# Patient Record
Sex: Male | Born: 1970
Health system: Southern US, Community
[De-identification: ages and names within clinical notes are randomized; demographics above are authoritative.]

## PROBLEM LIST (undated history)

## (undated) DIAGNOSIS — T7840XA Allergy, unspecified, initial encounter: Secondary | ICD-10-CM

## (undated) DIAGNOSIS — D869 Sarcoidosis, unspecified: Secondary | ICD-10-CM

## (undated) DIAGNOSIS — F419 Anxiety disorder, unspecified: Secondary | ICD-10-CM

## (undated) HISTORY — PX: SPINE SURGERY: SHX786

## (undated) HISTORY — DX: Sarcoidosis, unspecified: D86.9

## (undated) HISTORY — DX: Allergy, unspecified, initial encounter: T78.40XA

## (undated) HISTORY — DX: Anxiety disorder, unspecified: F41.9

---

## 2006-03-25 ENCOUNTER — Ambulatory Visit: Payer: Self-pay | Admitting: Family Medicine

## 2009-07-01 ENCOUNTER — Ambulatory Visit: Payer: Self-pay | Admitting: Family Medicine

## 2009-07-07 ENCOUNTER — Encounter: Payer: Self-pay | Admitting: Family Medicine

## 2009-08-03 ENCOUNTER — Encounter: Payer: Self-pay | Admitting: Family Medicine

## 2011-03-16 ENCOUNTER — Ambulatory Visit: Payer: Self-pay | Admitting: Otolaryngology

## 2012-04-05 DIAGNOSIS — D869 Sarcoidosis, unspecified: Secondary | ICD-10-CM

## 2012-04-05 HISTORY — DX: Sarcoidosis, unspecified: D86.9

## 2012-09-01 ENCOUNTER — Ambulatory Visit: Payer: Self-pay

## 2012-09-03 HISTORY — PX: VIDEO BRONCHOSCOPY WITH ENDOBRONCHIAL ULTRASOUND: SHX6177

## 2012-09-19 ENCOUNTER — Ambulatory Visit: Payer: Self-pay

## 2013-12-19 ENCOUNTER — Encounter: Payer: Self-pay | Admitting: Pulmonary Disease

## 2013-12-19 ENCOUNTER — Ambulatory Visit (INDEPENDENT_AMBULATORY_CARE_PROVIDER_SITE_OTHER): Payer: BC Managed Care – PPO | Admitting: Pulmonary Disease

## 2013-12-19 VITALS — BP 124/68 | HR 58 | Temp 98.3°F | Ht 72.0 in | Wt 181.0 lb

## 2013-12-19 DIAGNOSIS — J99 Respiratory disorders in diseases classified elsewhere: Secondary | ICD-10-CM

## 2013-12-19 DIAGNOSIS — D869 Sarcoidosis, unspecified: Secondary | ICD-10-CM

## 2013-12-19 DIAGNOSIS — D860 Sarcoidosis of lung: Secondary | ICD-10-CM | POA: Insufficient documentation

## 2013-12-19 DIAGNOSIS — Z23 Encounter for immunization: Secondary | ICD-10-CM

## 2013-12-19 NOTE — Assessment & Plan Note (Signed)
Based on the descriptions of what was seen on his imagining and pathology at [facility], he has Stage 1 pulmonary sarcoidosis which has been asymptomatic with the exception of some mild arthralgias.  He has not shown signs of progression and it is likely that this will remain the case.  However, it should be noted that there were fungal hyphae seen on the FNA of the subcarinal node while all other culture data is positive.  I don't anticipate him developing respiratory symptoms (as most stage 1's don't), but if he does then he needs a bronch with an BAL for fungal culture prior to the initiation of systemic steroids.  Plan: -annual opthalmology visits -baseline EKG today -baseline CXR -flu shot today -f/u one year

## 2013-12-19 NOTE — Progress Notes (Signed)
Subjective:    Patient ID: Jorge Palmer, male    DOB: Aug 12, 1970, 43 y.o.   MRN: [id number]  HPI  Jorge Palmer is here to see me because he carries a diagnosis of sarcoidosis.  He was diagnosed in July 2014 with a bronchoscopy and an EBUS guided FNA of his level 7 mediastinal lymph node.  He had undergone a CT of his abdomen which showed pumonary nodules in the bases of his lungs which lead to the bronchoscopy.  Two summers ago he was hospitalized for pneumonia while on vacation in [place].  He was told that he had a bacterial and viral pneumonia based on blood testing.  Since then no complaints.  No shortness of breath, no cough.  Occasionally he will feel short of breath when he climbs a flight of stairs, but nothing too common.     He has had some joint pain and followed by a rheumatologist at [facility].  She treated him with meloxicam at 15mg  which seems to work fairly well.  He occasionally has back pain which is unrelated to his sarcoid.   Past Medical History  Diagnosis Date  . Sarcoidosis      Family History  Problem Relation Age of Onset  . Cancer Maternal Grandfather     lung  . Cancer Maternal Grandmother     ovarian  . Cancer Father     melanoma  . Heart disease Maternal Grandfather   . Rheum arthritis Maternal Grandmother      History   Social History  . Marital Status: Married    Spouse Name: N/A    Number of Children: N/A  . Years of Education: N/A   Occupational History  . Not on file.   Social History Main Topics  . Smoking status: Former Smoker -- 0.50 packs/day for 6 years    Types: Cigarettes    Quit date: 12/20/2003  . Smokeless tobacco: Never Used  . Alcohol Use: Yes     Comment: socially  . Drug Use: No  . Sexual Activity: Not on file   Other Topics Concern  . Not on file   Social History Narrative  . No narrative on file     Allergies  Allergen Reactions  . Sulfa Antibiotics     Rash-as an infant     No outpatient  prescriptions prior to visit.   No facility-administered medications prior to visit.      Review of Systems  Constitutional: Negative for fever and unexpected weight change.  HENT: Positive for congestion. Negative for dental problem, ear pain, nosebleeds, postnasal drip, rhinorrhea, sinus pressure, sneezing, sore throat and trouble swallowing.   Eyes: Negative for redness and itching.  Respiratory: Negative for cough, chest tightness, shortness of breath and wheezing.   Cardiovascular: Negative for palpitations and leg swelling.  Gastrointestinal: Negative for nausea and vomiting.  Genitourinary: Negative for dysuria.  Musculoskeletal: Negative for joint swelling.  Skin: Negative for rash.  Neurological: Negative for headaches.  Hematological: Does not bruise/bleed easily.  Psychiatric/Behavioral: Negative for dysphoric mood. The patient is not nervous/anxious.        Objective:   Physical Exam Filed Vitals:   12/19/13 1547  BP: 124/68  Pulse: 58  Temp: 98.3 F (36.8 C)  TempSrc: Oral  Height: 6' (1.829 m)  Weight: 181 lb (82.101 kg)  SpO2: 98%   Gen: well appearing, no acute distress HEENT: NCAT, PERRL, EOMi, OP clear, neck supple without masses PULM: CTA B CV: RRR, no  mgr, no JVD AB: BS+, soft, nontender, no hsm Ext: warm, no edema, no clubbing, no cyanosis Derm: no rash or skin breakdown Neuro: A&Ox4, CN II-XII intact, strength 5/5 in all 4 extremities       Assessment & Plan:   Pulmonary sarcoidosis Based on the descriptions of what was seen on his imagining and pathology at [facility], he has Stage 1 pulmonary sarcoidosis which has been asymptomatic with the exception of some mild arthralgias.  He has not shown signs of progression and it is likely that this will remain the case.  However, it should be noted that there were fungal hyphae seen on the FNA of the subcarinal node while all other culture data is positive.  I don't anticipate him developing respiratory  symptoms (as most stage 1's don't), but if he does then he needs a bronch with an BAL for fungal culture prior to the initiation of systemic steroids.  Plan: -annual opthalmology visits -baseline EKG today -baseline CXR -flu shot today -f/u one year   Updated Medication List Outpatient Encounter Prescriptions as of 12/19/2013  Medication Sig  . meloxicam (MOBIC) 15 MG tablet Take 15 mg by mouth daily.  . traMADol (ULTRAM) 50 MG tablet Take 50 mg by mouth every 6 (six) hours as needed.

## 2013-12-19 NOTE — Patient Instructions (Signed)
We will have you get a baseline chest X-ray at the [facility] office Keep getting your eyes checked annually We will see you back in one year, let us know if you need to see [place] sooner than that

## 2014-05-21 ENCOUNTER — Ambulatory Visit (INDEPENDENT_AMBULATORY_CARE_PROVIDER_SITE_OTHER)
Admission: RE | Admit: 2014-05-21 | Discharge: 2014-05-21 | Disposition: A | Discharge status: Home / Self Care | Payer: Self-pay | Source: Ambulatory Visit | Attending: Pulmonary Disease | Admitting: Pulmonary Disease

## 2014-05-21 DIAGNOSIS — D869 Sarcoidosis, unspecified: Secondary | ICD-10-CM

## 2014-05-22 NOTE — Progress Notes (Signed)
Quick Note:  Pt aware of results and recs. Has recall in chart for BT later this year. Advised to call us if he has any breathing issues. Nothing further needed. ______

## 2014-12-13 ENCOUNTER — Other Ambulatory Visit: Payer: Self-pay | Admitting: Orthopaedic Surgery

## 2014-12-13 DIAGNOSIS — M5416 Radiculopathy, lumbar region: Secondary | ICD-10-CM

## 2014-12-16 ENCOUNTER — Ambulatory Visit
Admission: RE | Admit: 2014-12-16 | Discharge: 2014-12-16 | Disposition: A | Discharge status: Home / Self Care | Payer: BLUE CROSS/BLUE SHIELD | Source: Ambulatory Visit | Attending: Orthopaedic Surgery | Admitting: Orthopaedic Surgery

## 2014-12-16 DIAGNOSIS — M5416 Radiculopathy, lumbar region: Secondary | ICD-10-CM

## 2015-01-07 ENCOUNTER — Telehealth: Payer: Self-pay | Admitting: Pulmonary Disease

## 2015-01-07 NOTE — Telephone Encounter (Signed)
Patient calling to see if anything has been ordered to have done prior to his appointment with Dr. Lake Bells.  Per Dr. Anastasia Pall last OV notes, there was nothing ordered to be done prior to appointment. Patient notified. Nothing further needed.

## 2015-01-16 ENCOUNTER — Encounter: Payer: Self-pay | Admitting: Family Medicine

## 2015-01-16 DIAGNOSIS — M4716 Other spondylosis with myelopathy, lumbar region: Secondary | ICD-10-CM | POA: Insufficient documentation

## 2015-02-17 ENCOUNTER — Encounter: Payer: Self-pay | Admitting: Pulmonary Disease

## 2015-02-17 ENCOUNTER — Ambulatory Visit (INDEPENDENT_AMBULATORY_CARE_PROVIDER_SITE_OTHER)
Admission: RE | Admit: 2015-02-17 | Discharge: 2015-02-17 | Disposition: A | Discharge status: Home / Self Care | Payer: BLUE CROSS/BLUE SHIELD | Source: Ambulatory Visit | Attending: Pulmonary Disease | Admitting: Pulmonary Disease

## 2015-02-17 ENCOUNTER — Ambulatory Visit (INDEPENDENT_AMBULATORY_CARE_PROVIDER_SITE_OTHER): Payer: BLUE CROSS/BLUE SHIELD | Admitting: Pulmonary Disease

## 2015-02-17 VITALS — BP 128/66 | HR 65 | Ht 72.0 in | Wt 189.0 lb

## 2015-02-17 DIAGNOSIS — D860 Sarcoidosis of lung: Secondary | ICD-10-CM

## 2015-02-17 DIAGNOSIS — Z23 Encounter for immunization: Secondary | ICD-10-CM | POA: Diagnosis not present

## 2015-02-17 DIAGNOSIS — R400 Somnolence: Secondary | ICD-10-CM | POA: Insufficient documentation

## 2015-02-17 DIAGNOSIS — G4710 Hypersomnia, unspecified: Secondary | ICD-10-CM

## 2015-02-17 NOTE — Patient Instructions (Signed)
Minimize alcohol and caffeine intake Exercise during the daytime Follow sleep hygiene sheet we gave you: Keep the lights off at night, no TV in the bedroom We will call you with the results of the chest x-ray Follow-up with [place] if you have a respiratory problem.

## 2015-02-17 NOTE — Assessment & Plan Note (Signed)
He has daytime somnolence with some snoring at night. His lung disease is not significant enough to dramatically increase his risk for obstructive sleep apnea and he is a thin individual so it's hard to say that I think he has obstructive sleep apnea. However, he does not have very good sleep hygiene as he notes that he sleeps with the television on at night. Today we reviewed sleep hygiene including caffeine and alcohol use and the bedroom environment. If he does not have improvement in his symptoms after adjusting these lifestyle modifications then we could consider a sleep study.

## 2015-02-17 NOTE — Assessment & Plan Note (Addendum)
He has mild pulmonary sarcoidosis with no symptoms and no significant chest x-ray findings. Last year's chest x-ray was normal. Because he has not had an exacerbation of his disease I do not think we need to see him any further. Plan: Chest x-ray today Follow-up if he develops respiratory symptoms.

## 2015-02-17 NOTE — Progress Notes (Signed)
Subjective:    Patient ID: Jorge Palmer, male    DOB: [birth year], 44 y.o.   MRN: [id number]  Synopsis: Diagnosed with sarcoidosis at [facility] with a fine-needle aspiration of a mediastinal lymph node. Disease has been relatively quiet since the time of diagnosis.  HPI Chief Complaint  Patient presents with  . Follow-up    annual rov.  pt has no breathing complaints today.     No breathing trouble in the last year. His wife says that he will gag from time to time at night. He gets sleepy a lot during the daytime.  He notes restless sleeping during the daytime.  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  He has morning headaches from time to time. He associates it with his sinus problems. He will ocassionally urinate in the middle of the night, not often.    Past Medical History  Diagnosis Date  . Sarcoidosis (HCC)       Review of Systems     Objective:   Physical Exam Filed Vitals:   02/17/15 1156  BP: 128/66  Pulse: 65  Height: 6' (1.829 m)  Weight: 189 lb (85.73 kg)  SpO2: 98%  RA  Gen: well appearing HENT: OP clear, TM's clear, neck supple PULM: CTA B, normal percussion CV: RRR, no mgr, trace edema GI: BS+, soft, nontender Derm: no cyanosis or rash Psyche: normal mood and affect         Assessment & Plan:  Pulmonary sarcoidosis He has mild pulmonary sarcoidosis with no symptoms and no significant chest x-ray findings. Last year's chest x-ray was normal. Because he has not had an exacerbation of his disease I do not think we need to see him any further. Plan: Chest x-ray today Follow-up if he develops respiratory symptoms.  Daytime somnolence He has daytime somnolence with some snoring at night. His lung disease is not significant enough to dramatically increase his risk for obstructive sleep apnea and he is a thin individual so it's hard to say that I think he has obstructive sleep apnea. However, he does not have very  good sleep hygiene as he notes that he sleeps with the television on at night. Today we reviewed sleep hygiene including caffeine and alcohol use and the bedroom environment. If he does not have improvement in his symptoms after adjusting these lifestyle modifications then we could consider a sleep study.     Current outpatient prescriptions:  .  cetirizine (ZYRTEC) 10 MG tablet, Take 10 mg by mouth daily., Disp: , Rfl:  .  gabapentin (NEURONTIN) 600 MG tablet, Take 600 mg by mouth 3 (three) times daily., Disp: , Rfl:  .  traMADol (ULTRAM) 50 MG tablet, Take 50 mg by mouth every 6 (six) hours as needed., Disp: , Rfl:

## 2015-02-19 NOTE — Progress Notes (Signed)
Quick Note:  Spoke with pt and notified of results per Dr. McQuaid. Pt verbalized understanding and denied any questions.  ______ 

## 2015-09-26 DIAGNOSIS — L281 Prurigo nodularis: Secondary | ICD-10-CM | POA: Diagnosis not present

## 2015-09-26 DIAGNOSIS — L700 Acne vulgaris: Secondary | ICD-10-CM | POA: Diagnosis not present

## 2015-10-01 DIAGNOSIS — M5106 Intervertebral disc disorders with myelopathy, lumbar region: Secondary | ICD-10-CM | POA: Diagnosis not present

## 2015-10-01 DIAGNOSIS — M4806 Spinal stenosis, lumbar region: Secondary | ICD-10-CM | POA: Diagnosis not present

## 2015-10-01 DIAGNOSIS — M4716 Other spondylosis with myelopathy, lumbar region: Secondary | ICD-10-CM | POA: Diagnosis not present

## 2015-10-03 ENCOUNTER — Other Ambulatory Visit: Payer: Self-pay | Admitting: Orthopaedic Surgery

## 2015-10-03 DIAGNOSIS — M5106 Intervertebral disc disorders with myelopathy, lumbar region: Secondary | ICD-10-CM

## 2015-10-03 DIAGNOSIS — M4716 Other spondylosis with myelopathy, lumbar region: Secondary | ICD-10-CM

## 2015-10-10 DIAGNOSIS — M5126 Other intervertebral disc displacement, lumbar region: Secondary | ICD-10-CM | POA: Diagnosis not present

## 2015-10-10 DIAGNOSIS — M4806 Spinal stenosis, lumbar region: Secondary | ICD-10-CM | POA: Diagnosis not present

## 2015-10-10 DIAGNOSIS — M5127 Other intervertebral disc displacement, lumbosacral region: Secondary | ICD-10-CM | POA: Diagnosis not present

## 2016-01-01 DIAGNOSIS — Z4689 Encounter for fitting and adjustment of other specified devices: Secondary | ICD-10-CM | POA: Diagnosis not present

## 2016-01-01 DIAGNOSIS — M5136 Other intervertebral disc degeneration, lumbar region: Secondary | ICD-10-CM | POA: Diagnosis not present

## 2016-01-01 DIAGNOSIS — M545 Low back pain: Secondary | ICD-10-CM | POA: Diagnosis not present

## 2016-01-04 HISTORY — PX: OTHER SURGICAL HISTORY: SHX169

## 2016-01-06 DIAGNOSIS — Z01818 Encounter for other preprocedural examination: Secondary | ICD-10-CM | POA: Diagnosis not present

## 2016-01-12 DIAGNOSIS — Z8673 Personal history of transient ischemic attack (TIA), and cerebral infarction without residual deficits: Secondary | ICD-10-CM | POA: Diagnosis not present

## 2016-01-12 DIAGNOSIS — R51 Headache: Secondary | ICD-10-CM | POA: Diagnosis not present

## 2016-01-12 DIAGNOSIS — N189 Chronic kidney disease, unspecified: Secondary | ICD-10-CM | POA: Diagnosis not present

## 2016-01-12 DIAGNOSIS — M48061 Spinal stenosis, lumbar region without neurogenic claudication: Secondary | ICD-10-CM | POA: Diagnosis not present

## 2016-01-12 DIAGNOSIS — M4716 Other spondylosis with myelopathy, lumbar region: Secondary | ICD-10-CM | POA: Diagnosis not present

## 2016-01-12 DIAGNOSIS — M5106 Intervertebral disc disorders with myelopathy, lumbar region: Secondary | ICD-10-CM | POA: Diagnosis not present

## 2016-01-12 DIAGNOSIS — M4726 Other spondylosis with radiculopathy, lumbar region: Secondary | ICD-10-CM | POA: Diagnosis not present

## 2016-01-12 DIAGNOSIS — Z79899 Other long term (current) drug therapy: Secondary | ICD-10-CM | POA: Diagnosis not present

## 2016-01-12 DIAGNOSIS — Z981 Arthrodesis status: Secondary | ICD-10-CM | POA: Diagnosis not present

## 2016-01-13 ENCOUNTER — Encounter: Payer: Self-pay | Admitting: Family Medicine

## 2016-01-13 DIAGNOSIS — Z79899 Other long term (current) drug therapy: Secondary | ICD-10-CM | POA: Diagnosis not present

## 2016-01-13 DIAGNOSIS — N189 Chronic kidney disease, unspecified: Secondary | ICD-10-CM | POA: Diagnosis not present

## 2016-01-13 DIAGNOSIS — M4726 Other spondylosis with radiculopathy, lumbar region: Secondary | ICD-10-CM | POA: Diagnosis not present

## 2016-01-13 DIAGNOSIS — R51 Headache: Secondary | ICD-10-CM | POA: Diagnosis not present

## 2016-01-13 DIAGNOSIS — Z8673 Personal history of transient ischemic attack (TIA), and cerebral infarction without residual deficits: Secondary | ICD-10-CM | POA: Diagnosis not present

## 2016-04-30 DIAGNOSIS — M5416 Radiculopathy, lumbar region: Secondary | ICD-10-CM | POA: Diagnosis not present

## 2016-04-30 DIAGNOSIS — Z6825 Body mass index (BMI) 25.0-25.9, adult: Secondary | ICD-10-CM | POA: Diagnosis not present

## 2016-04-30 DIAGNOSIS — M5136 Other intervertebral disc degeneration, lumbar region: Secondary | ICD-10-CM | POA: Diagnosis not present

## 2016-09-24 DIAGNOSIS — L281 Prurigo nodularis: Secondary | ICD-10-CM | POA: Diagnosis not present

## 2016-09-24 DIAGNOSIS — L700 Acne vulgaris: Secondary | ICD-10-CM | POA: Diagnosis not present

## 2017-06-21 ENCOUNTER — Ambulatory Visit: Payer: BLUE CROSS/BLUE SHIELD | Admitting: Family Medicine

## 2017-06-21 ENCOUNTER — Encounter: Payer: Self-pay | Admitting: Family Medicine

## 2017-06-21 VITALS — BP 136/82 | HR 65 | Temp 98.3°F | Resp 16 | Ht 72.0 in | Wt 183.1 lb

## 2017-06-21 DIAGNOSIS — J3089 Other allergic rhinitis: Secondary | ICD-10-CM | POA: Diagnosis not present

## 2017-06-21 DIAGNOSIS — K580 Irritable bowel syndrome with diarrhea: Secondary | ICD-10-CM | POA: Diagnosis not present

## 2017-06-21 DIAGNOSIS — M4716 Other spondylosis with myelopathy, lumbar region: Secondary | ICD-10-CM

## 2017-06-21 DIAGNOSIS — D860 Sarcoidosis of lung: Secondary | ICD-10-CM | POA: Diagnosis not present

## 2017-06-21 DIAGNOSIS — R1013 Epigastric pain: Secondary | ICD-10-CM

## 2017-06-21 DIAGNOSIS — Z131 Encounter for screening for diabetes mellitus: Secondary | ICD-10-CM | POA: Diagnosis not present

## 2017-06-21 DIAGNOSIS — J302 Other seasonal allergic rhinitis: Secondary | ICD-10-CM | POA: Diagnosis not present

## 2017-06-21 DIAGNOSIS — R110 Nausea: Secondary | ICD-10-CM

## 2017-06-21 DIAGNOSIS — Z13220 Encounter for screening for lipoid disorders: Secondary | ICD-10-CM | POA: Diagnosis not present

## 2017-06-21 DIAGNOSIS — K589 Irritable bowel syndrome without diarrhea: Secondary | ICD-10-CM | POA: Insufficient documentation

## 2017-06-21 MED ORDER — TRAMADOL HCL 50 MG PO TABS: 50.0000 mg | ORAL_TABLET | Freq: Four times a day (QID) | ORAL | 0 refills | Status: DC | PRN

## 2017-06-21 NOTE — Patient Instructions (Signed)
Research IBS diarrhea medication: Rebeca Allegra

## 2017-06-21 NOTE — Progress Notes (Signed)
Name: Jorge Palmer   MRN: [id number]    DOB: 1971-01-04   Date:06/21/2017       Progress Note  Subjective  Chief Complaint  Chief Complaint  Patient presents with  . GI Problem    Onset-6 to 8 weeks, has been nausea and feels like something is stuck in his throat, going to bathroom more frequently. Has tried Prilosec and imodium. Has sharp upper abdominal pains after eating and discomforter. Pain will wake him up and would like to discuss his symptoms.    HPI  Dyspepsia: he states symptoms started about 2 months ago. He has an almost constant sensation of indigestion, nausea, but no vomiting. Also some increase in bowel frequency ( worse than his regular IBS symptoms) . Pain on epigastric area that is sharp and may last hours, and also burning sensation from epigastric area to his throat. He denies any blood in stools, weight loss or change in appetite. He is worried about his gallbladder ( father recent had emergency cholecystectomy). No fever or chills.   Pulmonary sarcoidosis: seen at [facility] and Dr. Kendrick Fries in the past, but released from their care, denies cough, wheezing or SOB, we will repeat CXR  IBS: he states he has increase in bowel frequency associated with cramping and urge to defecate since college years. He states symptoms improve with Imodium, and worse with stress. He is not sure if he ever took medication for it.   Low back pain: seen by neurosurgeon and takes prn tramadol, states since back surgery he feels much better. Only occasionally needs to take medication ( usually after yard work) no radiculitis at this time.    Patient Active Problem List   Diagnosis Date Noted  . Daytime somnolence 02/17/2015  . Lumbar spondylosis with myelopathy 01/16/2015  . Pulmonary sarcoidosis (HCC) 12/19/2013    Past Surgical History:  Procedure Laterality Date  . microlaminectomy L4-5  01/2016    Family History  Problem Relation Age of Onset  . Cancer Maternal Grandfather     lung  . Heart disease Maternal Grandfather   . Cancer Maternal Grandmother        ovarian  . Rheum arthritis Maternal Grandmother   . Cancer Father        melanoma  . Migraines Sister   . Parkinson's disease Paternal Grandmother   . Liver disease Paternal Grandfather     Social History   Socioeconomic History  . Marital status: Married    Spouse name: Selena Batten   . Number of children: 1  . Years of education: Not on file  . Highest education level: Bachelor's degree (e.g., BA, AB, BS)  Social Needs  . Financial resource strain: Not hard at all  . Food insecurity - worry: Never true  . Food insecurity - inability: Never true  . Transportation needs - medical: No  . Transportation needs - non-medical: No  Occupational History  . Occupation: area Warehouse manager     Comment: [employer].   Tobacco Use  . Smoking status: Former Smoker    Packs/day: 0.50    Years: 6.00    Pack years: 3.00    Types: Cigarettes    Start date: 04/05/1997    Last attempt to quit: 12/20/2003    Years since quitting: 13.5  . Smokeless tobacco: Never Used  Substance and Sexual Activity  . Alcohol use: No    Frequency: Never    Comment: since 2017 never a heavy drinker  . Drug use:  No  . Sexual activity: Yes    Partners: Female  Other Topics Concern  . Not on file  Social History Narrative   Married, one child.      Current Outpatient Medications:  .  omeprazole (PRILOSEC) 20 MG capsule, Take 20 mg by mouth daily., Disp: , Rfl:  .  traMADol (ULTRAM) 50 MG tablet, Take 1 tablet (50 mg total) by mouth every 6 (six) hours as needed., Disp: 30 tablet, Rfl: 0  Allergies  Allergen Reactions  . Sulfa Antibiotics     Rash-as an infant     ROS  Ten systems reviewed and is negative except as mentioned in HPI   Objective  Vitals:   06/21/17 1156  BP: 136/82  Pulse: 65  Resp: 16  Temp: 98.3 F (36.8 C)  TempSrc: Oral  SpO2: 97%  Weight: 183 lb 1.6 oz (83.1 kg)  Height: 6' (1.829  m)    Body mass index is 24.83 kg/m.  Physical Exam  Constitutional: Patient appears well-developed and well-nourished. No distress.  HEENT: head atraumatic, normocephalic, pupils equal and reactive to light, neck supple, throat within normal limits Cardiovascular: Normal rate, regular rhythm and normal heart sounds.  No murmur heard. No BLE edema. Pulmonary/Chest: Effort normal and breath sounds normal. No respiratory distress. Abdominal: Soft.  There is epigastric  Tenderness, negative for masses, normal bowel sounds. Psychiatric: Patient has a normal mood and affect. behavior is normal. Judgment and thought content normal.  PHQ2/9: Depression screen PHQ 2/9 06/21/2017  Decreased Interest 0  Down, Depressed, Hopeless 0  PHQ - 2 Score 0     Fall Risk: Fall Risk  06/21/2017  Falls in the past year? No     Functional Status Survey: Is the patient deaf or have difficulty hearing?: Yes Does the patient have difficulty seeing, even when wearing glasses/contacts?: Yes Does the patient have difficulty concentrating, remembering, or making decisions?: No Does the patient have difficulty walking or climbing stairs?: No Does the patient have difficulty dressing or bathing?: No Does the patient have difficulty doing errands alone such as visiting a doctor's office or shopping?: No    Assessment & Plan  1. Pulmonary sarcoidosis (HCC)  Repeat CXR   2. Perennial allergic rhinitis with seasonal variation  He does not like taking medications for it  3. Lumbar spondylosis with myelopathy  - traMADol (ULTRAM) 50 MG tablet; Take 1 tablet (50 mg total) by mouth every 6 (six) hours as needed.  Dispense: 30 tablet; Refill: 0  4. Lipid screening  - Lipid panel  5. Epigastric pain  - CBC with Differential/Platelet - COMPLETE METABOLIC PANEL WITH GFR - US Abdomen Limited RUQ; Future - Lipase  6. Nausea  - CBC with Differential/Platelet - COMPLETE METABOLIC PANEL WITH GFR -  US Abdomen Limited RUQ; Future - Lipase  7. Irritable bowel syndrome with diarrhea  He will research Viberzi and Xyfaxin   8. Diabetes mellitus screening  - Hemoglobin A1c  9. Dyspepsia  - H. pylori breath test - US Abdomen Limited RUQ; Future - Lipase

## 2017-06-28 ENCOUNTER — Ambulatory Visit
Admission: RE | Admit: 2017-06-28 | Discharge: 2017-06-28 | Disposition: A | Discharge status: Home / Self Care | Payer: BLUE CROSS/BLUE SHIELD | Source: Ambulatory Visit | Attending: Family Medicine | Admitting: Family Medicine

## 2017-06-28 DIAGNOSIS — D860 Sarcoidosis of lung: Secondary | ICD-10-CM

## 2017-06-28 DIAGNOSIS — Z13220 Encounter for screening for lipoid disorders: Secondary | ICD-10-CM | POA: Diagnosis not present

## 2017-06-28 DIAGNOSIS — R1013 Epigastric pain: Secondary | ICD-10-CM | POA: Insufficient documentation

## 2017-06-28 DIAGNOSIS — R110 Nausea: Secondary | ICD-10-CM | POA: Diagnosis not present

## 2017-06-28 DIAGNOSIS — R918 Other nonspecific abnormal finding of lung field: Secondary | ICD-10-CM | POA: Diagnosis not present

## 2017-06-28 DIAGNOSIS — Z131 Encounter for screening for diabetes mellitus: Secondary | ICD-10-CM | POA: Diagnosis not present

## 2017-06-29 LAB — COMPLETE METABOLIC PANEL WITH GFR
AG Ratio: 2 (calc) (ref 1.0–2.5)
ALT: 25 U/L (ref 9–46)
AST: 25 U/L (ref 10–40)
Albumin: 4.6 g/dL (ref 3.6–5.1)
Alkaline phosphatase (APISO): 52 U/L (ref 40–115)
BUN: 21 mg/dL (ref 7–25)
CO2: 31 mmol/L (ref 20–32)
CREATININE: 1.08 mg/dL (ref 0.60–1.35)
Calcium: 9.4 mg/dL (ref 8.6–10.3)
Chloride: 104 mmol/L (ref 98–110)
GFR, Est African American: 96 mL/min/{1.73_m2} (ref >=60)
GFR, Est Non African American: 82 mL/min/{1.73_m2} (ref >=60)
GLUCOSE: 92 mg/dL (ref 65–99)
Globulin: 2.3 g/dL (calc) (ref 1.9–3.7)
Potassium: 4.5 mmol/L (ref 3.5–5.3)
Sodium: 142 mmol/L (ref 135–146)
Total Bilirubin: 1 mg/dL (ref 0.2–1.2)
Total Protein: 6.9 g/dL (ref 6.1–8.1)

## 2017-06-29 LAB — CBC WITH DIFFERENTIAL/PLATELET
Basophils Absolute: 42 cells/uL (ref 0–200)
Basophils Relative: 0.8 %
EOS PCT: 4 %
Eosinophils Absolute: 208 cells/uL (ref 15–500)
HCT: 44.6 % (ref 38.5–50.0)
HEMOGLOBIN: 15.2 g/dL (ref 13.2–17.1)
Lymphs Abs: 1050 cells/uL (ref 850–3900)
MCH: 30.1 pg (ref 27.0–33.0)
MCHC: 34.1 g/dL (ref 32.0–36.0)
MCV: 88.3 fL (ref 80.0–100.0)
MONOS PCT: 6.7 %
MPV: 11.5 fL (ref 7.5–12.5)
NEUTROS ABS: 3552 {cells}/uL (ref 1500–7800)
NEUTROS PCT: 68.3 %
Platelets: 262 10*3/uL (ref 140–400)
RBC: 5.05 10*6/uL (ref 4.20–5.80)
RDW: 12.1 % (ref 11.0–15.0)
Total Lymphocyte: 20.2 %
WBC mixed population: 348 cells/uL (ref 200–950)
WBC: 5.2 10*3/uL (ref 3.8–10.8)

## 2017-06-29 LAB — H. PYLORI BREATH TEST: H. pylori Breath Test: NOT DETECTED

## 2017-06-29 LAB — LIPID PANEL
CHOL/HDL RATIO: 4.4 (calc) (ref <5.0)
Cholesterol: 192 mg/dL (ref <200)
HDL: 44 mg/dL (ref >40)
LDL CHOLESTEROL (CALC): 128 mg/dL — AB
Non-HDL Cholesterol (Calc): 148 mg/dL (calc) — ABNORMAL HIGH (ref <130)
Triglycerides: 97 mg/dL (ref <150)

## 2017-06-29 LAB — LIPASE: Lipase: 9 U/L (ref 7–60)

## 2017-06-29 LAB — HEMOGLOBIN A1C
EAG (MMOL/L): 5.8 (calc)
HEMOGLOBIN A1C: 5.3 %{Hb} (ref <5.7)
Mean Plasma Glucose: 105 (calc)

## 2017-07-01 ENCOUNTER — Telehealth: Payer: Self-pay | Admitting: Family Medicine

## 2017-07-01 NOTE — Telephone Encounter (Signed)
Pt given results and documented in result note 

## 2017-07-01 NOTE — Telephone Encounter (Signed)
Copied from [place] [id number]. Topic: Quick Communication - Lab Results >> Jul 01, 2017  9:49 AM Vonna Kotyk, CMA wrote: Called patient to inform them of 06/28/2017 lab results. When patient returns call, triage nurse may disclose results. >> Jul 01, 2017 10:38 AM Scherrie Gerlach wrote: Please call pt back with results.  Unable to contact NT

## 2017-09-23 DIAGNOSIS — S20461A Insect bite (nonvenomous) of right back wall of thorax, initial encounter: Secondary | ICD-10-CM | POA: Diagnosis not present

## 2017-09-23 DIAGNOSIS — S50861A Insect bite (nonvenomous) of right forearm, initial encounter: Secondary | ICD-10-CM | POA: Diagnosis not present

## 2017-09-23 DIAGNOSIS — S50862A Insect bite (nonvenomous) of left forearm, initial encounter: Secondary | ICD-10-CM | POA: Diagnosis not present

## 2017-09-23 DIAGNOSIS — S70362A Insect bite (nonvenomous), left thigh, initial encounter: Secondary | ICD-10-CM | POA: Diagnosis not present

## 2018-04-07 DIAGNOSIS — M545 Low back pain: Secondary | ICD-10-CM | POA: Diagnosis not present

## 2018-04-07 DIAGNOSIS — M4716 Other spondylosis with myelopathy, lumbar region: Secondary | ICD-10-CM | POA: Diagnosis not present

## 2018-04-07 DIAGNOSIS — M5136 Other intervertebral disc degeneration, lumbar region: Secondary | ICD-10-CM | POA: Diagnosis not present

## 2018-09-02 ENCOUNTER — Encounter: Payer: Self-pay | Admitting: Emergency Medicine

## 2018-09-02 ENCOUNTER — Emergency Department: Payer: BLUE CROSS/BLUE SHIELD

## 2018-09-02 ENCOUNTER — Other Ambulatory Visit: Payer: Self-pay

## 2018-09-02 ENCOUNTER — Emergency Department
Admission: EM | Admit: 2018-09-02 | Discharge: 2018-09-02 | Disposition: A | Discharge status: Home / Self Care | Payer: BLUE CROSS/BLUE SHIELD | Attending: Emergency Medicine | Admitting: Emergency Medicine

## 2018-09-02 DIAGNOSIS — R109 Unspecified abdominal pain: Secondary | ICD-10-CM | POA: Diagnosis not present

## 2018-09-02 DIAGNOSIS — Z87891 Personal history of nicotine dependence: Secondary | ICD-10-CM | POA: Diagnosis not present

## 2018-09-02 DIAGNOSIS — N23 Unspecified renal colic: Secondary | ICD-10-CM | POA: Diagnosis not present

## 2018-09-02 DIAGNOSIS — R911 Solitary pulmonary nodule: Secondary | ICD-10-CM | POA: Insufficient documentation

## 2018-09-02 DIAGNOSIS — Z79899 Other long term (current) drug therapy: Secondary | ICD-10-CM | POA: Diagnosis not present

## 2018-09-02 DIAGNOSIS — R319 Hematuria, unspecified: Secondary | ICD-10-CM | POA: Diagnosis not present

## 2018-09-02 LAB — URINALYSIS, COMPLETE (UACMP) WITH MICROSCOPIC
Bacteria, UA: NONE SEEN
Bilirubin Urine: NEGATIVE
Glucose, UA: NEGATIVE mg/dL
Ketones, ur: NEGATIVE mg/dL
Leukocytes,Ua: NEGATIVE
Nitrite: NEGATIVE
Protein, ur: 30 mg/dL — AB
RBC / HPF: >50 RBC/hpf — ABNORMAL HIGH (ref 0–5)
Specific Gravity, Urine: 1.021 (ref 1.005–1.030)
Squamous Epithelial / LPF: NONE SEEN (ref 0–5)
pH: 6 (ref 5.0–8.0)

## 2018-09-02 LAB — CBC
HCT: 46.3 % (ref 39.0–52.0)
Hemoglobin: 15.9 g/dL (ref 13.0–17.0)
MCH: 30.9 pg (ref 26.0–34.0)
MCHC: 34.3 g/dL (ref 30.0–36.0)
MCV: 90.1 fL (ref 80.0–100.0)
Platelets: 220 10*3/uL (ref 150–400)
RBC: 5.14 MIL/uL (ref 4.22–5.81)
RDW: 12.1 % (ref 11.5–15.5)
WBC: 9.6 10*3/uL (ref 4.0–10.5)
nRBC: 0 % (ref 0.0–0.2)

## 2018-09-02 LAB — BASIC METABOLIC PANEL
Anion gap: 8 (ref 5–15)
BUN: 24 mg/dL — ABNORMAL HIGH (ref 6–20)
CO2: 29 mmol/L (ref 22–32)
Calcium: 9.1 mg/dL (ref 8.9–10.3)
Chloride: 103 mmol/L (ref 98–111)
Creatinine, Ser: 1.08 mg/dL (ref 0.61–1.24)
GFR calc Af Amer: >60 mL/min (ref >60)
GFR calc non Af Amer: >60 mL/min (ref >60)
Glucose, Bld: 139 mg/dL — ABNORMAL HIGH (ref 70–99)
Potassium: 4.1 mmol/L (ref 3.5–5.1)
Sodium: 140 mmol/L (ref 135–145)

## 2018-09-02 MED ORDER — ONDANSETRON 4 MG PO TBDP: 4.0000 mg | ORAL_TABLET | Freq: Three times a day (TID) | ORAL | 0 refills | Status: DC | PRN

## 2018-09-02 MED ORDER — OXYCODONE-ACETAMINOPHEN 10-325 MG PO TABS: 1.0000 | ORAL_TABLET | Freq: Four times a day (QID) | ORAL | 0 refills | Status: AC | PRN

## 2018-09-02 NOTE — Discharge Instructions (Addendum)
Please see primary care or return to the emergency department if the pain returns.  Follow up with [facility] regarding the new pulmonary nodule.

## 2018-09-02 NOTE — ED Triage Notes (Signed)
L flank pain began this am.

## 2018-09-02 NOTE — ED Notes (Signed)
Pt reports that starting this am he had sharp pain in left flank that radiates to front mid abd accompanied by nausea - the pain was 10/10 - he is having difficulty voiding (feels like he has to void and cannot) - at this time he has a dull ache in left flank region

## 2018-09-02 NOTE — ED Provider Notes (Signed)
[facility] EMERGENCY DEPARTMENT Provider Note   CSN: [id number] Arrival date & time: 09/02/18  1124    History   Chief Complaint Chief Complaint  Patient presents with  . Flank Pain    HPI Jorge Palmer is a 48 y.o. male.     48 year old male presenting to the emergency department for treatment and evaluation of left flank pain.  Pain started suddenly this morning.  He initially had some nausea but no vomiting.  He states that he feels the urge to urinate, but only small amounts with each attempt.  History of kidney stone approximately 20 years ago.     Past Medical History:  Diagnosis Date  . Sarcoidosis     Patient Active Problem List   Diagnosis Date Noted  . Perennial allergic rhinitis with seasonal variation 06/21/2017  . Irritable bowel syndrome with diarrhea 06/21/2017  . Irritable bowel syndrome (IBS) 06/21/2017  . Daytime somnolence 02/17/2015  . Lumbar spondylosis with myelopathy 01/16/2015  . Pulmonary sarcoidosis ([place]) 12/19/2013    Past Surgical History:  Procedure Laterality Date  . microlaminectomy L4-5  01/2016        Home Medications    Prior to Admission medications   Medication Sig Start Date End Date Taking? Authorizing Provider  cetirizine (ZYRTEC) 10 MG tablet Take 10 mg by mouth daily.    [provider]  gabapentin (NEURONTIN) 600 MG tablet Take 600 mg by mouth 3 (three) times daily.    [provider]  omeprazole (PRILOSEC) 20 MG capsule Take 20 mg by mouth daily.    [provider]  ondansetron (ZOFRAN-ODT) 4 MG disintegrating tablet Take 1 tablet (4 mg total) by mouth every 8 (eight) hours as needed for nausea or vomiting. 09/02/18   Shed Nixon B, FNP  oxyCODONE-acetaminophen (PERCOCET) 10-325 MG tablet Take 1 tablet by mouth every 6 (six) hours as needed for pain. 09/02/18 09/02/19  Meher Kucinski, Dessa Phi, FNP  traMADol (ULTRAM) 50 MG tablet Take 1 tablet (50 mg total) by mouth every  6 (six) hours as needed. 06/21/17   Steele Sizer, MD    Family History Family History  Problem Relation Age of Onset  . Cancer Maternal Grandfather        lung  . Heart disease Maternal Grandfather   . Cancer Maternal Grandmother        ovarian  . Rheum arthritis Maternal Grandmother   . Cancer Father        melanoma  . Migraines Sister   . Parkinson's disease Paternal Grandmother   . Liver disease Paternal Grandfather     Social History Social History   Tobacco Use  . Smoking status: Former Smoker    Packs/day: 0.50    Years: 6.00    Pack years: 3.00    Types: Cigarettes    Start date: 04/05/1997    Last attempt to quit: 12/20/2003    Years since quitting: 14.7  . Smokeless tobacco: Never Used  Substance Use Topics  . Alcohol use: No    Frequency: Never    Comment: since 2017 never a heavy drinker  . Drug use: No     Allergies   Sulfa antibiotics   Review of Systems Review of Systems  Constitutional: Negative.   HENT: Negative.   Eyes: Negative.   Respiratory: Negative.   Cardiovascular: Negative.   Gastrointestinal: Positive for nausea.  Genitourinary: Positive for flank pain.  Neurological: Negative.   Psychiatric/Behavioral: Negative.  Physical Exam Updated Vital Signs BP 116/60   Pulse 60   Temp 98.3 F (36.8 C) (Oral)   Resp 18   Ht 6' (1.829 m)   Wt 83.9 kg   SpO2 97%   BMI 25.09 kg/m   Physical Exam Vitals signs reviewed.  HENT:     Head: Normocephalic.     Nose: Nose normal.     Mouth/Throat:     Mouth: Mucous membranes are moist.  Neck:     Musculoskeletal: Neck supple.  Cardiovascular:     Rate and Rhythm: Normal rate and regular rhythm.  Pulmonary:     Effort: Pulmonary effort is normal.     Breath sounds: Normal breath sounds.  Abdominal:     General: There is no distension.     Palpations: Abdomen is soft.     Tenderness: There is no abdominal tenderness. There is left CVA tenderness. There is no guarding.   Musculoskeletal: Normal range of motion.  Skin:    General: Skin is warm and dry.  Neurological:     General: No focal deficit present.     Mental Status: He is alert.  Psychiatric:        Mood and Affect: Mood normal.        Thought Content: Thought content normal.        Judgment: Judgment normal.      ED Treatments / Results  Labs (all labs ordered are listed, but only abnormal results are displayed) Labs Reviewed  URINALYSIS, COMPLETE (UACMP) WITH MICROSCOPIC - Abnormal; Notable for the following components:      Result Value   Color, Urine YELLOW (*)    APPearance HAZY (*)    Hgb urine dipstick LARGE (*)    Protein, ur 30 (*)    RBC / HPF >50 (*)    All other components within normal limits  BASIC METABOLIC PANEL - Abnormal; Notable for the following components:   Glucose, Bld 139 (*)    BUN 24 (*)    All other components within normal limits  CBC    EKG None  Radiology Ct Renal Stone Study  Result Date: 09/02/2018 CLINICAL DATA:  Left flank pain and hematuria. EXAM: CT ABDOMEN AND PELVIS WITHOUT CONTRAST TECHNIQUE: Multidetector CT imaging of the abdomen and pelvis was performed following the standard protocol without IV contrast. COMPARISON:  09/01/2012 FINDINGS: Lower chest: Several nodules in the left lower lobe measuring up to 10 mm are stable. There is 1 nodule in the posterior right lower lobe measuring 4 mm which is new. See image 10 of series 4. Other small nodules in the right lower lobe are unchanged. Hepatobiliary: Unremarkable Pancreas: Unremarkable Spleen: Unremarkable Adrenals/Urinary Tract: Right kidney and adrenal glands are within normal limits. Stable simple cyst in the mid left kidney. This is nonspecific. There is no hydronephrosis. Bladder is unremarkable. Stomach/Bowel: Normal appendix. No obvious mass in the colon. Small bowel is decompressed. Stomach is also decompressed. Vascular/Lymphatic: No evidence of aortic aneurysm. No abnormal  retroperitoneal adenopathy. Reproductive: Normal prostate. Other: No free fluid. Musculoskeletal: No vertebral compression deformity. Degenerative disc disease at L4-5 and L5-S1. IMPRESSION: No evidence of urinary obstruction or urinary calculus. Small pulmonary nodules in the lung bases are noted. Most are stable. There is a new 4 mm right lower lobe pulmonary nodule. No follow-up needed if patient is low-risk. Non-contrast chest CT can be considered in 12 months if patient is high-risk. This recommendation follows the consensus statement: Guidelines for Management of  Incidental Pulmonary Nodules Detected on CT Images: From the Fleischner Society 2017; Radiology 2017; 284:228-243. Electronically Signed   By: Marybelle Killings M.D.   On: 09/02/2018 13:22    Procedures Procedures (including critical care time)  Medications Ordered in ED Medications - No data to display   Initial Impression / Assessment and Plan / ED Course  I have reviewed the triage vital signs and the nursing notes.  Pertinent labs & imaging results that were available during my care of the patient were reviewed by me and considered in my medical decision making (see chart for details).        48 year old male presenting to the emergency department with left flank pain.  Location of pain, and microscopic hematuria are most consistent with kidney stone.  CT to be done.  Patient's pain is currently well controlled and he denies nausea.  ----------------------------------------- 1:45 PM on 09/02/2018 -----------------------------------------  No stone visualized on the noncontrast CT scan.  Based on patient's symptoms, location of the pain, and the microscopic hematuria he has likely passed the kidney stone.  While here, he denies having had any sharp pain as this morning.  He states that there is a slightly dull ache in his lower back that comes around into the left pelvic area.  Results of the CT were discussed with the patient.   He will be given prescriptions as listed below.  He was encouraged to follow-up with his primary care provider or return to the emergency department if the sharp pain returns.  Incidentally, the CT showed a new pulmonary nodule.  Patient was aware of these and states that he has a history of sarcoidosis but has not been to his pulmonologist at [facility] and quite some time.  He was advised that he should follow-up with them within the next several months.  Final Clinical Impressions(s) / ED Diagnoses   Final diagnoses:  Renal colic on left side  Pulmonary nodule    ED Discharge Orders         Ordered    oxyCODONE-acetaminophen (PERCOCET) 10-325 MG tablet  Every 6 hours PRN     09/02/18 1342    ondansetron (ZOFRAN-ODT) 4 MG disintegrating tablet  Every 8 hours PRN     09/02/18 1342           Victorino Dike, FNP 09/02/18 1348    Delman Kitten, MD 09/02/18 [phone]

## 2018-09-26 DIAGNOSIS — D2272 Melanocytic nevi of left lower limb, including hip: Secondary | ICD-10-CM | POA: Diagnosis not present

## 2018-09-26 DIAGNOSIS — D2261 Melanocytic nevi of right upper limb, including shoulder: Secondary | ICD-10-CM | POA: Diagnosis not present

## 2018-09-26 DIAGNOSIS — D2262 Melanocytic nevi of left upper limb, including shoulder: Secondary | ICD-10-CM | POA: Diagnosis not present

## 2018-09-26 DIAGNOSIS — D225 Melanocytic nevi of trunk: Secondary | ICD-10-CM | POA: Diagnosis not present

## 2018-11-20 DIAGNOSIS — H02409 Unspecified ptosis of unspecified eyelid: Secondary | ICD-10-CM | POA: Diagnosis not present

## 2019-01-05 DIAGNOSIS — H57813 Brow ptosis, bilateral: Secondary | ICD-10-CM | POA: Diagnosis not present

## 2019-01-05 DIAGNOSIS — H02839 Dermatochalasis of unspecified eye, unspecified eyelid: Secondary | ICD-10-CM | POA: Diagnosis not present

## 2019-01-05 DIAGNOSIS — H5340 Unspecified visual field defects: Secondary | ICD-10-CM | POA: Diagnosis not present

## 2019-02-09 ENCOUNTER — Other Ambulatory Visit: Payer: Self-pay | Admitting: Oncology

## 2019-02-09 DIAGNOSIS — R911 Solitary pulmonary nodule: Secondary | ICD-10-CM

## 2019-02-09 NOTE — Progress Notes (Signed)
  Pulmonary Nodule Clinic Telephone Note  Received referral from Dr. Steele Sizer.  Patient was evaluated in the emergency department on 09/02/2018 for treatment and evaluation of left flank pain.  Pain was sudden and presented with nausea but no vomiting.  Work-up included a urinalysis with microscopic hematuria.  Noncontrasted CT renal stone study revealed small pulmonary nodules in the lung base.  Most were stable.  There is a new 4 mm right lower lobe pulmonary nodule.  No follow-up is needed if patient is low risk.  Noncontrasted chest CT can be centered in 12 months if patient is high risk.  Given patient is a former smoker I would recommend an additional CT scan in approximately 12 months to confirm stability.   Patient has past medical history of sarcoidosis but has not been evaluated by a pulmonologist in quite some time.  I personally reviewed all patient's previous imaging including most recent from 09/02/2018.  I recommend follow-up with noncontrasted CT scan of the chest in approximately 12 months from previous scan.  Patient does not currently smoke.  Has history of smoking for 6 years and 0.5 PPD.  High risk factors include: History of heavy smoking, exposure to asbestos, radium or uranium, personal family history of lung cancer, older age, sex (females greater than males), race (black and native [place] greater than weight), marginal speculation, upper lobe location, multiplicity (less than 5 nodules increases risk for malignancy) and emphysema and/or pulmonary fibrosis.   This recommendation follows the consensus statement: Guidelines for Management of Incidental Pulmonary Nodules Detected on CT Images: From the Fleischner Society 2017; Radiology 2017; 284:228-243.    I have placed order for CT scan without contrast to be completed approximately 12 months from previous CT scan.    I will touch base with patient and schedule him/her virtually for results of the CT scan and  recommendations per Fleischner's guidelines and our pulmonary nodule clinic.  Faythe Casa, NP 02/09/2019 11:21 AM

## 2019-02-10 ENCOUNTER — Encounter: Payer: Self-pay | Admitting: Emergency Medicine

## 2019-02-10 ENCOUNTER — Other Ambulatory Visit: Payer: Self-pay

## 2019-02-10 ENCOUNTER — Emergency Department
Admission: EM | Admit: 2019-02-10 | Discharge: 2019-02-10 | Disposition: A | Discharge status: Home / Self Care | Payer: BLUE CROSS/BLUE SHIELD | Attending: Student | Admitting: Student

## 2019-02-10 DIAGNOSIS — Y93H2 Activity, gardening and landscaping: Secondary | ICD-10-CM | POA: Insufficient documentation

## 2019-02-10 DIAGNOSIS — Z87891 Personal history of nicotine dependence: Secondary | ICD-10-CM | POA: Diagnosis not present

## 2019-02-10 DIAGNOSIS — Z79899 Other long term (current) drug therapy: Secondary | ICD-10-CM | POA: Insufficient documentation

## 2019-02-10 DIAGNOSIS — Y9222 Religious institution as the place of occurrence of the external cause: Secondary | ICD-10-CM | POA: Diagnosis not present

## 2019-02-10 DIAGNOSIS — Z23 Encounter for immunization: Secondary | ICD-10-CM | POA: Insufficient documentation

## 2019-02-10 DIAGNOSIS — W293XXA Contact with powered garden and outdoor hand tools and machinery, initial encounter: Secondary | ICD-10-CM | POA: Insufficient documentation

## 2019-02-10 DIAGNOSIS — S61212A Laceration without foreign body of right middle finger without damage to nail, initial encounter: Secondary | ICD-10-CM | POA: Diagnosis not present

## 2019-02-10 DIAGNOSIS — S61213A Laceration without foreign body of left middle finger without damage to nail, initial encounter: Secondary | ICD-10-CM | POA: Diagnosis not present

## 2019-02-10 DIAGNOSIS — Y999 Unspecified external cause status: Secondary | ICD-10-CM | POA: Diagnosis not present

## 2019-02-10 MED ORDER — TETANUS-DIPHTH-ACELL PERTUSSIS 5-2.5-18.5 LF-MCG/0.5 IM SUSP
0.5000 mL | Freq: Once | INTRAMUSCULAR | Status: AC
  Administered 2019-02-10: 0.5 mL via INTRAMUSCULAR
  Filled 2019-02-10: qty 0.5

## 2019-02-10 MED ORDER — CEPHALEXIN 500 MG PO CAPS: 500.0000 mg | ORAL_CAPSULE | Freq: Four times a day (QID) | ORAL | 0 refills | Status: AC

## 2019-02-10 MED ORDER — LIDOCAINE HCL (PF) 1 % IJ SOLN
5.0000 mL | Freq: Once | INTRAMUSCULAR | Status: AC
  Administered 2019-02-10: 5 mL via INTRADERMAL
  Filled 2019-02-10: qty 5

## 2019-02-10 NOTE — ED Provider Notes (Signed)
[facility] Emergency Department Provider Note  ____________________________________________  Time seen: Approximately 12:19 PM  I have reviewed the triage vital signs and the nursing notes.   HISTORY  Chief Complaint Laceration    HPI Jorge Palmer is a 48 y.o. male that presents to the emergency department for evaluation of finger laceration.  Patient was using a hedge tremors at church this morning when he cut his finger.  He states that nurse working with him thought he may need stitches so he came to the ER for evaluation.  Last tetanus shot was 14 years ago.  Past Medical History:  Diagnosis Date  . Sarcoidosis     Patient Active Problem List   Diagnosis Date Noted  . Perennial allergic rhinitis with seasonal variation 06/21/2017  . Irritable bowel syndrome with diarrhea 06/21/2017  . Irritable bowel syndrome (IBS) 06/21/2017  . Daytime somnolence 02/17/2015  . Lumbar spondylosis with myelopathy 01/16/2015  . Pulmonary sarcoidosis ([place]) 12/19/2013    Past Surgical History:  Procedure Laterality Date  . microlaminectomy L4-5  01/2016    Prior to Admission medications   Medication Sig Start Date End Date Taking? Authorizing Provider  cephALEXin (KEFLEX) 500 MG capsule Take 1 capsule (500 mg total) by mouth 4 (four) times daily for 10 days. 02/10/19 02/20/19  Laban Emperor, PA-C  cetirizine (ZYRTEC) 10 MG tablet Take 10 mg by mouth daily.    [provider]  gabapentin (NEURONTIN) 600 MG tablet Take 600 mg by mouth 3 (three) times daily.    [provider]  omeprazole (PRILOSEC) 20 MG capsule Take 20 mg by mouth daily.    [provider]  ondansetron (ZOFRAN-ODT) 4 MG disintegrating tablet Take 1 tablet (4 mg total) by mouth every 8 (eight) hours as needed for nausea or vomiting. 09/02/18   Triplett, Cari B, FNP  oxyCODONE-acetaminophen (PERCOCET) 10-325 MG tablet Take 1 tablet by mouth every 6 (six) hours as needed  for pain. 09/02/18 09/02/19  Triplett, Dessa Phi, FNP  traMADol (ULTRAM) 50 MG tablet Take 1 tablet (50 mg total) by mouth every 6 (six) hours as needed. 06/21/17   Steele Sizer, MD    Allergies Sulfa antibiotics  Family History  Problem Relation Age of Onset  . Cancer Maternal Grandfather        lung  . Heart disease Maternal Grandfather   . Cancer Maternal Grandmother        ovarian  . Rheum arthritis Maternal Grandmother   . Cancer Father        melanoma  . Migraines Sister   . Parkinson's disease Paternal Grandmother   . Liver disease Paternal Grandfather     Social History Social History   Tobacco Use  . Smoking status: Former Smoker    Packs/day: 0.50    Years: 6.00    Pack years: 3.00    Types: Cigarettes    Start date: 04/05/1997    Quit date: 12/20/2003    Years since quitting: 15.1  . Smokeless tobacco: Never Used  Substance Use Topics  . Alcohol use: No    Frequency: Never    Comment: since 2017 never a heavy drinker  . Drug use: No     Review of Systems  Musculoskeletal: Positive for finger pain. Skin: Negative for rash, ecchymosis.  Positive for laceration. Neurological: Negative for numbness or tingling   ____________________________________________   PHYSICAL EXAM:  VITAL SIGNS: ED Triage Vitals  Enc Vitals Group     BP 02/10/19  1103 133/81     Pulse Rate 02/10/19 1103 63     Resp 02/10/19 1103 20     Temp 02/10/19 1103 98.8 F (37.1 C)     Temp Source 02/10/19 1103 Oral     SpO2 02/10/19 1103 96 %     Weight 02/10/19 1102 185 lb (83.9 kg)     Height 02/10/19 1102 5\' 11"  (1.803 m)     Head Circumference --      Peak Flow --      Pain Score 02/10/19 1102 0     Pain Loc --      Pain Edu? --      Excl. in [place]? --      Constitutional: Alert and oriented. Well appearing and in no acute distress. Eyes: Conjunctivae are normal. PERRL. EOMI. Head: Atraumatic. ENT:      Ears:      Nose: No congestion/rhinnorhea.      Mouth/Throat: Mucous  membranes are moist.  Neck: No stridor.  Cardiovascular: Normal rate, regular rhythm.  Good peripheral circulation. Respiratory: Normal respiratory effort without tachypnea or retractions. Lungs CTAB. Good air entry to the bases with no decreased or absent breath sounds. Musculoskeletal: Full range of motion to all extremities. No gross deformities appreciated. Neurologic:  Normal speech and language. No gross focal neurologic deficits are appreciated.  Skin:  Skin is warm, dry.  1 cm semicircle flap laceration to distal left middle finger. Psychiatric: Mood and affect are normal. Speech and behavior are normal. Patient exhibits appropriate insight and judgement.   ____________________________________________   LABS (all labs ordered are listed, but only abnormal results are displayed)  Labs Reviewed - No data to display ____________________________________________  EKG   ____________________________________________  RADIOLOGY   No results found.  ____________________________________________    PROCEDURES  Procedure(s) performed:    Procedures  LACERATION REPAIR Performed by: Laban Emperor  Consent: Verbal consent obtained.  Consent given by: patient  Prepped and Draped in normal sterile fashion  Wound explored: No foreign bodies   Laceration Location: finger  Laceration Length: 1 cm  Anesthesia: None  Local anesthetic: lidocaine 1% without epinephrine  Anesthetic total: 3 ml  Irrigation method: syringe  Amount of cleaning: 526ml normal saline  Skin closure: 4-0 nylon  Number of sutures: 3  Technique: Simple interrupted  Patient tolerance: Patient tolerated the procedure well with no immediate complications.  Medications  lidocaine (PF) (XYLOCAINE) 1 % injection 5 mL (5 mLs Intradermal Given 02/10/19 1344)  Tdap (BOOSTRIX) injection 0.5 mL (0.5 mLs Intramuscular Given 02/10/19 1234)  lidocaine (PF) (XYLOCAINE) 1 % injection 5 mL (5 mLs  Intradermal Given 02/10/19 1344)     ____________________________________________   INITIAL IMPRESSION / ASSESSMENT AND PLAN / ED COURSE  Pertinent labs & imaging results that were available during my care of the patient were reviewed by me and considered in my medical decision making (see chart for details).  Review of the [place] CSRS was performed in accordance of the [place] prior to dispensing any controlled drugs.    Patient presented to emergency department for evaluation of finger laceration.  Vital signs and exam are reassuring.  Laceration was repaired with stitches.  Tetanus shot was updated.  Patient will be discharged home with prescriptions for Keflex. Patient is to follow up with hand orthopedics as directed.  Referral to Dr. Peggye Ley was given.  Patient is given ED precautions to return to the ED for any worsening or new symptoms.   [name]  was evaluated in Emergency Department on 02/10/2019 for the symptoms described in the history of present illness. He was evaluated in the context of the global COVID-19 pandemic, which necessitated consideration that the patient might be at risk for infection with the SARS-CoV-2 virus that causes COVID-19. Institutional protocols and algorithms that pertain to the evaluation of patients at risk for COVID-19 are in a state of rapid change based on information released by regulatory bodies including the CDC and federal and state organizations. These policies and algorithms were followed during the patient's care in the ED.  ____________________________________________  FINAL CLINICAL IMPRESSION(S) / ED DIAGNOSES  Final diagnoses:  Laceration of right middle finger without foreign body without damage to nail, initial encounter      NEW MEDICATIONS STARTED DURING THIS VISIT:  ED Discharge Orders         Ordered    cephALEXin (KEFLEX) 500 MG capsule  4 times daily     02/10/19 1323              This chart was dictated using  voice recognition software/Dragon. Despite best efforts to proofread, errors can occur which can change the meaning. Any change was purely unintentional.    Laban Emperor, PA-C 02/10/19 1429    Lilia Pro., MD 02/10/19 [phone]

## 2019-02-10 NOTE — ED Triage Notes (Signed)
Pt reports was trimming hedges at a church and the blade caught his right hand middlie finger cutting him. Pt reports bled a lot there and a RN who was volunteering told him he needed stitches.

## 2019-02-10 NOTE — ED Notes (Signed)
Pt verbalized understanding of discharge instructions. NAD at this time. 

## 2019-02-10 NOTE — ED Notes (Signed)
Pt to ED with laceration to middle finger on right hand. Pt states finger got caught in an Engineer, maintenance (IT). Bleeding controlled at this time.

## 2019-02-13 DIAGNOSIS — S61212A Laceration without foreign body of right middle finger without damage to nail, initial encounter: Secondary | ICD-10-CM | POA: Diagnosis not present

## 2019-02-16 ENCOUNTER — Telehealth: Payer: Self-pay | Admitting: *Deleted

## 2019-02-16 NOTE — Telephone Encounter (Signed)
Pt made aware of referral to Lung Nodule Clinic from PCP. Pt is in agreement to have upcoming CT scan and follow up as recommended.  Pt has been made aware of upcoming appts for follow up CT scan and follow up appt with Jennifer Burns, NP in the Lung Nodule Clinic. Pt verbalized understanding. Nothing further needed at this time.  

## 2019-02-16 NOTE — Telephone Encounter (Signed)
Referral received for pt to be seen in Lung Nodule Clinic for further workup of incidental lung nodule with follow up CT scan. Left message with patient to call back to discuss clinic and review recommendations and upcoming appts including follow up CT scan and visit with Jenny Burns, NP to discuss results. Awaiting call back.  

## 2019-02-19 DIAGNOSIS — S61212D Laceration without foreign body of right middle finger without damage to nail, subsequent encounter: Secondary | ICD-10-CM | POA: Diagnosis not present

## 2019-09-04 ENCOUNTER — Ambulatory Visit
Admission: RE | Admit: 2019-09-04 | Discharge: 2019-09-04 | Disposition: A | Discharge status: Home / Self Care | Payer: BC Managed Care – PPO | Source: Ambulatory Visit | Attending: Oncology | Admitting: Oncology

## 2019-09-04 ENCOUNTER — Other Ambulatory Visit: Payer: Self-pay

## 2019-09-04 DIAGNOSIS — R911 Solitary pulmonary nodule: Secondary | ICD-10-CM | POA: Diagnosis not present

## 2019-09-04 DIAGNOSIS — D860 Sarcoidosis of lung: Secondary | ICD-10-CM | POA: Diagnosis not present

## 2019-09-04 DIAGNOSIS — R918 Other nonspecific abnormal finding of lung field: Secondary | ICD-10-CM | POA: Diagnosis not present

## 2019-09-05 ENCOUNTER — Inpatient Hospital Stay: Payer: BC Managed Care – PPO | Attending: Oncology | Admitting: Oncology

## 2019-09-05 ENCOUNTER — Encounter (INDEPENDENT_AMBULATORY_CARE_PROVIDER_SITE_OTHER): Payer: Self-pay

## 2019-09-05 DIAGNOSIS — Z808 Family history of malignant neoplasm of other organs or systems: Secondary | ICD-10-CM | POA: Insufficient documentation

## 2019-09-05 DIAGNOSIS — R911 Solitary pulmonary nodule: Secondary | ICD-10-CM

## 2019-09-05 DIAGNOSIS — D860 Sarcoidosis of lung: Secondary | ICD-10-CM | POA: Diagnosis not present

## 2019-09-05 DIAGNOSIS — Z87891 Personal history of nicotine dependence: Secondary | ICD-10-CM | POA: Diagnosis not present

## 2019-09-05 DIAGNOSIS — R0602 Shortness of breath: Secondary | ICD-10-CM | POA: Insufficient documentation

## 2019-09-05 DIAGNOSIS — Z801 Family history of malignant neoplasm of trachea, bronchus and lung: Secondary | ICD-10-CM | POA: Insufficient documentation

## 2019-09-05 NOTE — Progress Notes (Signed)
Pulmonary Nodule Clinic Consult note [facility]  [phone] [phone]  Patient Care Team: Alba Cory, MD as PCP - General (Family Medicine) Patricia Nettle, MD as Consulting Physician (Orthopedic Surgery) Lupita Leash, MD as Consulting Physician (Pulmonary Disease)   Name of the patient: Jorge Palmer  [id number]  1970/05/26   Date of visit: 09/05/2019   Diagnosis- Lung Nodule  Chief complaint/ Reason for visit- Pulmonary Nodule Clinic Initial Visit  Past Medical History: Mr. Jorge Palmer is a 49 year old male with past medical history significant for pulmonary sarcoidosis, allergic rhinitis, IBS with diarrhea and lumbar spondylosis who was evaluated in the emergency room on 09/02/2018 for treatment and evaluation of left flank pain.  Work-up included a urinalysis with microscopic hematuria.  Noncontrast CT showed small pulmonary nodules in the lung bases.  There was a new 4 mm right lower lobe pulmonary nodule.  Noncontrast chest CT was recommended in 12 months if patient is considered high risk.  Given patient is a former smoker, it was recommended an additional CT scan in 12 months to confirm stability.  In the interim, patient was evaluated in the emergency room for a finger laceration on 02/10/2019.  Interval history- Today, Mr. Ehrman presents today to pulmonary nodule clinic to discuss most recent CT chest findings.  He smoked socially while in college for approximately 6 to 8 years.  He recurrent non-smoker.  He smoked about half a pack per day.  He denies any personal history of cancer.  He has family history of melanoma (father), cervical cancer (maternal grandmother) and lung cancer (maternal grandfather).  He denies any known occupational exposures that can cause cancer.  He currently works as VP for [employer]   Given his history of sarcoidosis, he has been followed by oncology Dr. Cherrie Distance and Dr. Kendrick Fries.   Records for pulmonary nodules/sarcoidosis noted back to June 2014 with Dr. Amanda Pea with [facility].  He is also followed by an optometrist secondary to bilateral upper eyelid dermatochalasis.  Recommended a upper eyelid blepharoplasty and mid forehead brow lift.  I do not see any recent pulmonology visits.  Today, he states he has occasional intermittent shortness of breath mainly with exertion.  Shortness of breath does not occur every day but happens every few weeks where he has to stop and take deep breaths.  He currently denies any respiratory medications such as inhalers.  He denies any recent fevers or illness, easy bleeding or bruising.  Reports a good appetite and denies any intentional weight loss.  He denies any chest pain, nausea, vomiting or constipation/diarrhea.  ECOG FS:0 - Asymptomatic  Review of systems- Review of Systems  Constitutional: Negative.  Negative for chills, fever, malaise/fatigue and weight loss.  HENT: Negative for congestion, ear pain and tinnitus.   Eyes: Negative.  Negative for blurred vision and double vision.  Respiratory: Positive for shortness of breath (Occasional). Negative for cough and sputum production.   Cardiovascular: Negative.  Negative for chest pain, palpitations and leg swelling.  Gastrointestinal: Negative.  Negative for abdominal pain, constipation, diarrhea, nausea and vomiting.  Genitourinary: Negative for dysuria, frequency and urgency.  Musculoskeletal: Negative for back pain and falls.  Skin: Negative.  Negative for rash.  Neurological: Negative.  Negative for weakness and headaches.  Endo/Heme/Allergies: Negative.  Does not bruise/bleed easily.  Psychiatric/Behavioral: Negative.  Negative for depression. The patient is not nervous/anxious and does not have insomnia.      Allergies  Allergen Reactions  . Sulfa  Antibiotics     Rash-as an infant     Past Medical History:  Diagnosis Date  . Sarcoidosis      Past Surgical  History:  Procedure Laterality Date  . microlaminectomy L4-5  01/2016    Social History   Socioeconomic History  . Marital status: Married    Spouse name: Jorge Palmer   . Number of children: 1  . Years of education: Not on file  . Highest education level: Bachelor's degree (e.g., BA, AB, BS)  Occupational History  . Occupation: area Warehouse manager     Comment: [employer] business.   Tobacco Use  . Smoking status: Former Smoker    Packs/day: 0.50    Years: 6.00    Pack years: 3.00    Types: Cigarettes    Start date: 04/05/1997    Quit date: 12/20/2003    Years since quitting: 15.7  . Smokeless tobacco: Never Used  Substance and Sexual Activity  . Alcohol use: No    Comment: since 2017 never a heavy drinker  . Drug use: No  . Sexual activity: Yes    Partners: Female  Other Topics Concern  . Not on file  Social History Narrative   Married, one child.    Social Determinants of Health   Financial Resource Strain:   . Difficulty of Paying Living Expenses:   Food Insecurity:   . Worried About Programme researcher, broadcasting/film/video in the Last Year:   . Barista in the Last Year:   Transportation Needs:   . Freight forwarder (Medical):   Marland Kitchen Lack of Transportation (Non-Medical):   Physical Activity:   . Days of Exercise per Week:   . Minutes of Exercise per Session:   Stress:   . Feeling of Stress :   Social Connections:   . Frequency of Communication with Friends and Family:   . Frequency of Social Gatherings with Friends and Family:   . Attends Religious Services:   . Active Member of Clubs or Organizations:   . Attends Banker Meetings:   Marland Kitchen Marital Status:   Intimate Partner Violence:   . Fear of Current or Ex-Partner:   . Emotionally Abused:   Marland Kitchen Physically Abused:   . Sexually Abused:     Family History  Problem Relation Age of Onset  . Cancer Maternal Grandfather        lung  . Heart disease Maternal Grandfather   . Cancer Maternal Grandmother         ovarian  . Rheum arthritis Maternal Grandmother   . Cancer Father        melanoma  . Migraines Sister   . Parkinson's disease Paternal Grandmother   . Liver disease Paternal Grandfather      Current Outpatient Medications:  .  cetirizine (ZYRTEC) 10 MG tablet, Take 10 mg by mouth daily., Disp: , Rfl:  .  gabapentin (NEURONTIN) 600 MG tablet, Take 600 mg by mouth 3 (three) times daily., Disp: , Rfl:  .  omeprazole (PRILOSEC) 20 MG capsule, Take 20 mg by mouth daily., Disp: , Rfl:  .  ondansetron (ZOFRAN-ODT) 4 MG disintegrating tablet, Take 1 tablet (4 mg total) by mouth every 8 (eight) hours as needed for nausea or vomiting., Disp: 20 tablet, Rfl: 0 .  traMADol (ULTRAM) 50 MG tablet, Take 1 tablet (50 mg total) by mouth every 6 (six) hours as needed., Disp: 30 tablet, Rfl: 0  Physical exam: There were no vitals filed  for this visit. Physical Exam Constitutional:      Appearance: Normal appearance.  HENT:     Head: Normocephalic and atraumatic.  Eyes:     Pupils: Pupils are equal, round, and reactive to light.  Cardiovascular:     Rate and Rhythm: Normal rate and regular rhythm.     Heart sounds: Normal heart sounds. No murmur.  Pulmonary:     Effort: Pulmonary effort is normal.     Breath sounds: Normal breath sounds. No wheezing.  Abdominal:     General: Bowel sounds are normal. There is no distension.     Palpations: Abdomen is soft.     Tenderness: There is no abdominal tenderness.  Musculoskeletal:        General: Normal range of motion.     Cervical back: Normal range of motion.  Skin:    General: Skin is warm and dry.     Findings: No rash.  Neurological:     Mental Status: He is alert and oriented to person, place, and time.  Psychiatric:        Judgment: Judgment normal.      CMP Latest Ref Rng & Units 09/02/2018  Glucose 70 - 99 mg/dL 601(U)  BUN 6 - 20 mg/dL 93(A)  Creatinine 3.55 - 1.24 mg/dL 7.32  Sodium 202 - 542 mmol/L 140  Potassium 3.5 - 5.1 mmol/L  4.1  Chloride 98 - 111 mmol/L 103  CO2 22 - 32 mmol/L 29  Calcium 8.9 - 10.3 mg/dL 9.1  Total Protein 6.1 - 8.1 g/dL -  Total Bilirubin 0.2 - 1.2 mg/dL -  AST 10 - 40 U/L -  ALT 9 - 46 U/L -   CBC Latest Ref Rng & Units 09/02/2018  WBC 4.0 - 10.5 K/uL 9.6  Hemoglobin 13.0 - 17.0 g/dL 70.6  Hematocrit 23.7 - 52.0 % 46.3  Platelets 150 - 400 K/uL 220    No images are attached to the encounter.  CT Chest Wo Contrast  Result Date: 09/04/2019 CLINICAL DATA:  Follow-up lung nodule, sarcoidosis EXAM: CT CHEST WITHOUT CONTRAST TECHNIQUE: Multidetector CT imaging of the chest was performed following the standard protocol without IV contrast. COMPARISON:  CT abdomen pelvis, 09/02/2018, CT chest, 09/19/2012 FINDINGS: Cardiovascular: No significant vascular findings. Normal heart size. No pericardial effusion. Mediastinum/Nodes: Numerous enlarged, calcified mediastinal and hilar lymph nodes, unchanged compared to prior examination dated 09/19/2012. Thyroid gland, trachea, and esophagus demonstrate no significant findings. Lungs/Pleura: There are numerous small pulmonary nodules throughout the lungs, many internally calcified. In general these are somewhat more numerous and slightly enlarged when compared to prior examination dated 09/19/2012, for example new clustered nodules in the posterior right upper lobe (series 3, image 79) and in the perihilar right lung (series 3, image 94). The largest, clearly benign and calcified nodules measure approximately 8 mm (series 3, image 127). No pleural effusion or pneumothorax. Upper Abdomen: No acute abnormality. Musculoskeletal: No chest wall mass or suspicious bone lesions identified. IMPRESSION: 1. There are numerous small pulmonary nodules throughout the lungs, many internally calcified. In general these are somewhat more numerous and slightly enlarged when compared to prior examination dated 09/19/2012, for example new clustered nodules in the posterior right upper  lobe and in the perihilar right lung. The largest, clearly benign and calcified nodules measure approximately 8 mm. Findings are consistent with pulmonary sarcoidosis which has slightly progressed in comparison to examination dated 2014. There is no evidence of associated fibrosis and no suspicious pulmonary nodules. 2. Numerous  enlarged, calcified mediastinal and hilar lymph nodes, unchanged compared to prior examination dated 09/19/2012, again in keeping with sarcoidosis. Electronically Signed   By: Lauralyn Primes M.D.   On: 09/04/2019 15:04     Assessment and plan- Patient is a 49 y.o. male who presents to pulmonary nodule clinic for follow-up of incidental lung nodules.     CT chest without contrast from 09/04/2019 reveals numerous small pulmonary nodules throughout the lungs that are calcified.  They are somewhat more numerous and slightly enlarged when compared to prior examination from September 19, 2012.  The largest, fairly benign calcified nodules noted.  Approximately 8 mm in size.  Findings are consistent with pulmonary sarcoidosis which is slightly progressed in comparison to examination since 2014 there is no evidence of associated fibrosis or suspicious pulmonary nodules.  There are numerous enlarged calcified mediastinal and hilar lymph nodes unchanged since prior examination.   Calculating malignancy probability of a pulmonary nodule: Risk factors include: 1.  Age. 2.  Cancer history. 3.  Diameter of pulmonary nodule and mm 4.  Location 5.  Smoking history 6.  Spiculation present   Based on risk factors, this patient is Low risk for the development of lung cancer.  I will touch base with Dr. Jayme Cloud for her opinion of recent imaging.  He does not meet criteria for low-dose CT screening.  He likely can be discharged from clinic   During our visit, we discussed pulmonary nodules are a common incidental finding and are often how lung cancer is discovered.  Lung cancer survival is directly  related to the stage at diagnosis.  We discussed that nodules can vary in presentation from solitary pulmonary nodules to masses, 2 groundglass opacities and multiple nodules.  Pulmonary nodules in the majority of cases are benign but the probability of these becoming malignant cannot be undermined.  Early identification of malignant nodules could lead to early diagnosis and increased survival.   We discussed the probability of pulmonary nodules becoming malignant increase with age, pack years of tobacco use, size/characteristics of the nodule and location; with upper lobe involvement being most worrisome.   We discussed the goal of our clinic is to thoroughly evaluate each nodule, developed a comprehensive, individualized plan of care utilizing the most advanced technology and significantly reduce the time from detection to treatment.  A dedicated pulmonary nodule clinic has proven to indeed expedite the detection and treatment of lung cancer.   Patient education in fact sheet provided along with most recent CT scans.   Plan:  Referral to pulmonology-Dr. Jayme Cloud No follow-up needed in lung nodule clinic  Disposition: Patient will likely be discharged from our pulmonary nodule clinic given his pulmonary nodules are likely related to sarcoidosis but will wait for Dr. Georgann Housekeeper opinion.   Visit Diagnosis 1. Lung nodule   2. Pulmonary sarcoidosis (HCC)     Patient expressed understanding and was in agreement with this plan. He also understands that He can call clinic at any time with any questions, concerns, or complaints.   Greater than 50% was spent in counseling and coordination of care with this patient including but not limited to discussion of the relevant topics above (See A&P) including, but not limited to diagnosis and management of acute and chronic medical conditions.   Thank you for allowing me to participate in the care of this very pleasant patient.    Mauro Kaufmann,  NP [facility] Cell - [phone] Pager- [phone] 09/06/2019 11:59 AM

## 2019-09-19 ENCOUNTER — Ambulatory Visit: Payer: BC Managed Care – PPO | Admitting: Pulmonary Disease

## 2019-09-19 ENCOUNTER — Encounter: Payer: Self-pay | Admitting: Pulmonary Disease

## 2019-09-19 ENCOUNTER — Other Ambulatory Visit
Admission: RE | Admit: 2019-09-19 | Discharge: 2019-09-19 | Disposition: A | Discharge status: Home / Self Care | Payer: BC Managed Care – PPO | Source: Ambulatory Visit | Attending: Pulmonary Disease | Admitting: Pulmonary Disease

## 2019-09-19 ENCOUNTER — Other Ambulatory Visit: Payer: Self-pay

## 2019-09-19 VITALS — BP 130/72 | HR 60 | Temp 98.3°F | Ht 71.0 in | Wt 186.6 lb

## 2019-09-19 DIAGNOSIS — D869 Sarcoidosis, unspecified: Secondary | ICD-10-CM

## 2019-09-19 DIAGNOSIS — R918 Other nonspecific abnormal finding of lung field: Secondary | ICD-10-CM

## 2019-09-19 NOTE — Patient Instructions (Signed)
We are going to get breathing tests  I am getting some blood samples to check for activity of sarcoid and for potential fungus particularly since you are bronchoscopy in the past showed some fungus.  We will see you in follow-up in 3 months time call sooner should any new difficulties arise

## 2019-09-20 LAB — ANGIOTENSIN CONVERTING ENZYME: Angiotensin-Converting Enzyme: 69 U/L (ref 14–82)

## 2019-09-20 LAB — HISTOPLASMA GAL'MANNAN AG SER: Histoplasma Gal'mannan Ag Ser: <0.5 (ref <0.5)

## 2019-09-21 LAB — MISC LABCORP TEST (SEND OUT)
Labcorp test code: 164293
Labcorp test code: 183805

## 2019-09-26 DIAGNOSIS — D2262 Melanocytic nevi of left upper limb, including shoulder: Secondary | ICD-10-CM | POA: Diagnosis not present

## 2019-09-26 DIAGNOSIS — D225 Melanocytic nevi of trunk: Secondary | ICD-10-CM | POA: Diagnosis not present

## 2019-09-26 DIAGNOSIS — D2271 Melanocytic nevi of right lower limb, including hip: Secondary | ICD-10-CM | POA: Diagnosis not present

## 2019-09-26 DIAGNOSIS — D2261 Melanocytic nevi of right upper limb, including shoulder: Secondary | ICD-10-CM | POA: Diagnosis not present

## 2019-10-11 ENCOUNTER — Other Ambulatory Visit: Payer: Self-pay | Admitting: Family Medicine

## 2019-10-11 DIAGNOSIS — M4716 Other spondylosis with myelopathy, lumbar region: Secondary | ICD-10-CM

## 2019-10-11 NOTE — Telephone Encounter (Signed)
Medication Refill - Medication: tramadol   Has the patient contacted their pharmacy? Yes.   Back pain  (Agent: If no, request that the patient contact the pharmacy for the refill.) (Agent: If yes, when and what did the pharmacy advise?)  Preferred Pharmacy (with phone number or street name):  [facility] [place], [place]  [address] [place] [address]  Phone: [phone] Fax: [phone]  Hours: Not open 24 hours     Agent: Please be advised that RX refills may take up to 3 business days. We ask that you follow-up with your pharmacy.

## 2019-11-12 ENCOUNTER — Telehealth: Payer: Self-pay

## 2019-11-12 NOTE — Telephone Encounter (Signed)
Pt is aware of date/time of covid test and voiced his understanding.  Nothing further is needed.

## 2019-11-15 ENCOUNTER — Other Ambulatory Visit: Payer: Self-pay

## 2019-11-15 ENCOUNTER — Other Ambulatory Visit
Admission: RE | Admit: 2019-11-15 | Discharge: 2019-11-15 | Disposition: A | Discharge status: Home / Self Care | Payer: BC Managed Care – PPO | Source: Ambulatory Visit | Attending: Pulmonary Disease | Admitting: Pulmonary Disease

## 2019-11-15 DIAGNOSIS — Z01812 Encounter for preprocedural laboratory examination: Secondary | ICD-10-CM | POA: Diagnosis not present

## 2019-11-15 DIAGNOSIS — Z20822 Contact with and (suspected) exposure to covid-19: Secondary | ICD-10-CM | POA: Diagnosis not present

## 2019-11-15 LAB — SARS CORONAVIRUS 2 (TAT 6-24 HRS): SARS Coronavirus 2: NEGATIVE

## 2019-11-16 ENCOUNTER — Ambulatory Visit: Payer: BC Managed Care – PPO | Attending: Pulmonary Disease

## 2019-11-16 DIAGNOSIS — D869 Sarcoidosis, unspecified: Secondary | ICD-10-CM | POA: Diagnosis not present

## 2019-11-16 LAB — PULMONARY FUNCTION TEST ARMC ONLY
DL/VA % pred: 97 %
DL/VA: 4.37 ml/min/mmHg/L
DLCO unc % pred: 96 %
DLCO unc: 29.78 ml/min/mmHg
FEF 25-75 Post: 3.6 L/sec
FEF 25-75 Pre: 3.42 L/sec
FEF2575-%Change-Post: 5 %
FEF2575-%Pred-Post: 96 %
FEF2575-%Pred-Pre: 92 %
FEV1-%Change-Post: 0 %
FEV1-%Pred-Post: 98 %
FEV1-%Pred-Pre: 97 %
FEV1-Post: 4.07 L
FEV1-Pre: 4.06 L
FEV1FVC-%Change-Post: 4 %
FEV1FVC-%Pred-Pre: 94 %
FEV6-%Change-Post: -3 %
FEV6-%Pred-Post: 101 %
FEV6-%Pred-Pre: 105 %
FEV6-Post: 5.23 L
FEV6-Pre: 5.43 L
FEV6FVC-%Change-Post: 0 %
FEV6FVC-%Pred-Post: 102 %
FEV6FVC-%Pred-Pre: 102 %
FVC-%Change-Post: -3 %
FVC-%Pred-Post: 98 %
FVC-%Pred-Pre: 102 %
FVC-Post: 5.25 L
Post FEV1/FVC ratio: 77 %
Post FEV6/FVC ratio: 100 %
Pre FEV1/FVC ratio: 74 %
Pre FEV6/FVC Ratio: 99 %
RV % pred: 88 %
RV: 1.79 L
TLC % pred: 93 %
TLC: 6.7 L

## 2019-11-16 MED ORDER — ALBUTEROL SULFATE (2.5 MG/3ML) 0.083% IN NEBU
2.5000 mg | INHALATION_SOLUTION | Freq: Once | RESPIRATORY_TRACT | Status: AC
  Administered 2019-11-16: 2.5 mg via RESPIRATORY_TRACT
  Filled 2019-11-16: qty 3

## 2019-12-25 DIAGNOSIS — Z20822 Contact with and (suspected) exposure to covid-19: Secondary | ICD-10-CM | POA: Diagnosis not present

## 2019-12-25 DIAGNOSIS — R0981 Nasal congestion: Secondary | ICD-10-CM | POA: Diagnosis not present

## 2020-04-05 DIAGNOSIS — H93293 Other abnormal auditory perceptions, bilateral: Secondary | ICD-10-CM | POA: Insufficient documentation

## 2020-10-16 DIAGNOSIS — D225 Melanocytic nevi of trunk: Secondary | ICD-10-CM | POA: Diagnosis not present

## 2020-10-16 DIAGNOSIS — D2262 Melanocytic nevi of left upper limb, including shoulder: Secondary | ICD-10-CM | POA: Diagnosis not present

## 2020-10-16 DIAGNOSIS — L538 Other specified erythematous conditions: Secondary | ICD-10-CM | POA: Diagnosis not present

## 2020-10-16 DIAGNOSIS — L820 Inflamed seborrheic keratosis: Secondary | ICD-10-CM | POA: Diagnosis not present

## 2020-10-16 DIAGNOSIS — D2271 Melanocytic nevi of right lower limb, including hip: Secondary | ICD-10-CM | POA: Diagnosis not present

## 2020-10-16 DIAGNOSIS — D2261 Melanocytic nevi of right upper limb, including shoulder: Secondary | ICD-10-CM | POA: Diagnosis not present

## 2020-11-01 IMAGING — CT CT RENAL STONE PROTOCOL
2 of 4 series · 16 of 46 positions shown, 18 images · non-contrast
Comparison: 09/01/2012

CLINICAL DATA: Left flank pain and hematuria.

EXAM:
CT ABDOMEN AND PELVIS WITHOUT CONTRAST
TECHNIQUE: Multidetector CT imaging of the abdomen and pelvis was performed
following the standard protocol without IV contrast.

[Series 2: stone full standard · axial · 0.76mm/px · z∈[-1030,-560]mm · 13 of 104 slices shown, 15 images]
[im 5/104  soft-tissue]
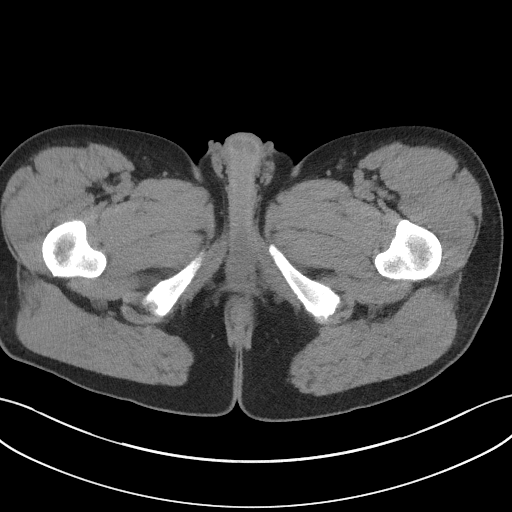
[im 5/104  bone]
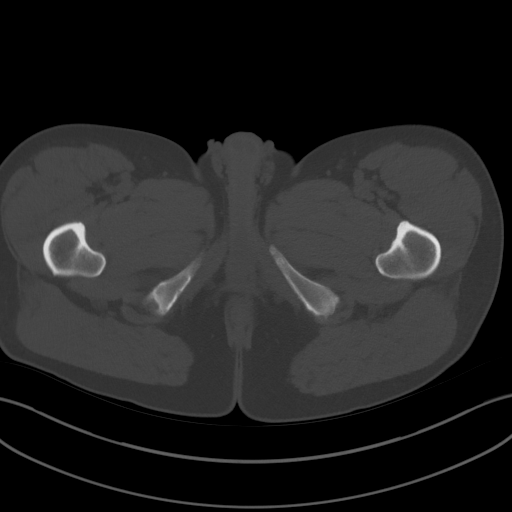
[im 13/104  soft-tissue]
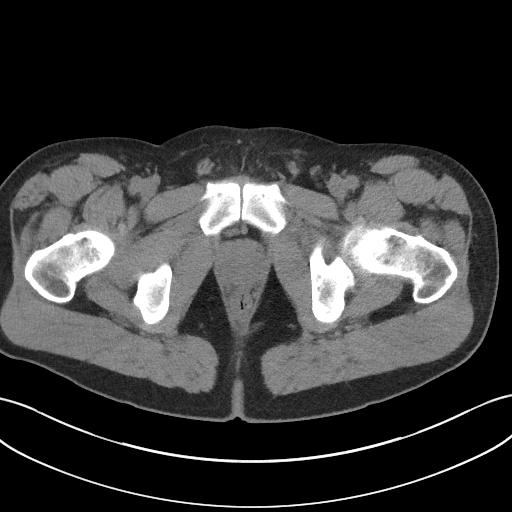
[im 22/104  soft-tissue]
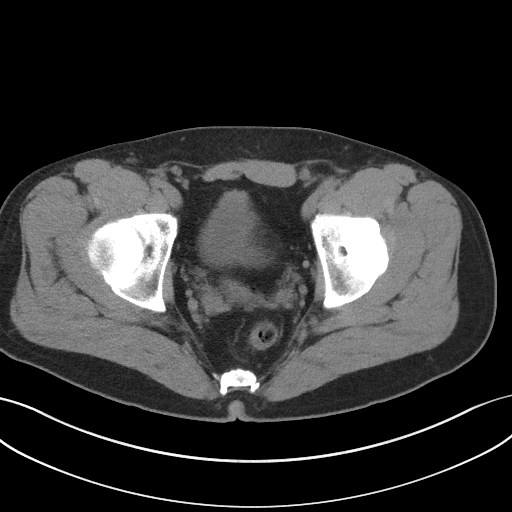
[im 31/104  soft-tissue]
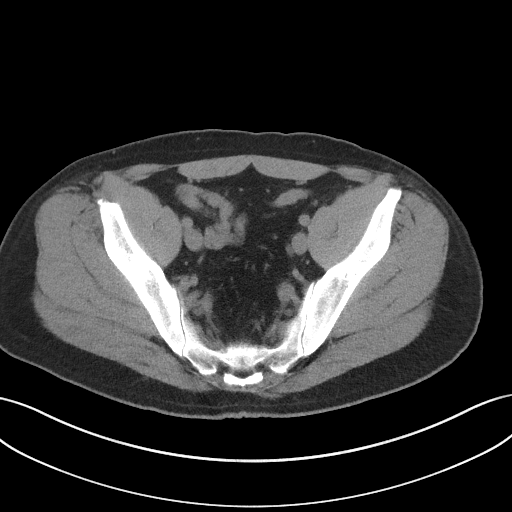
[im 35/104  soft-tissue]
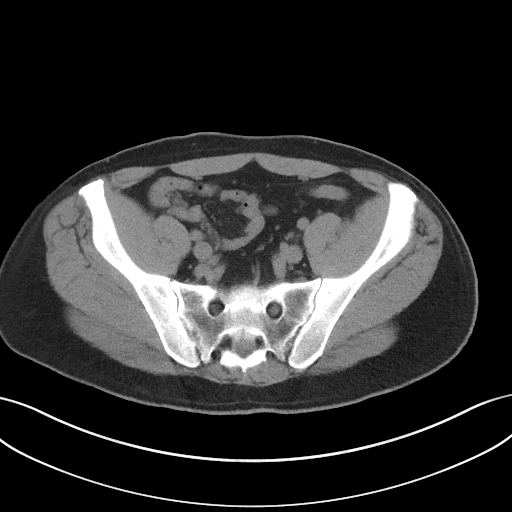
[im 43/104  soft-tissue]
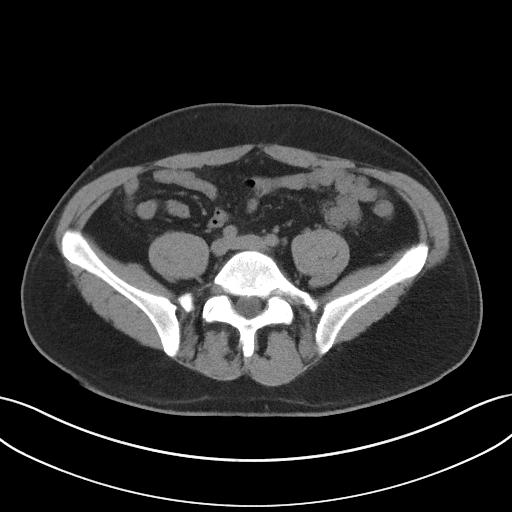
[im 52/104  soft-tissue]
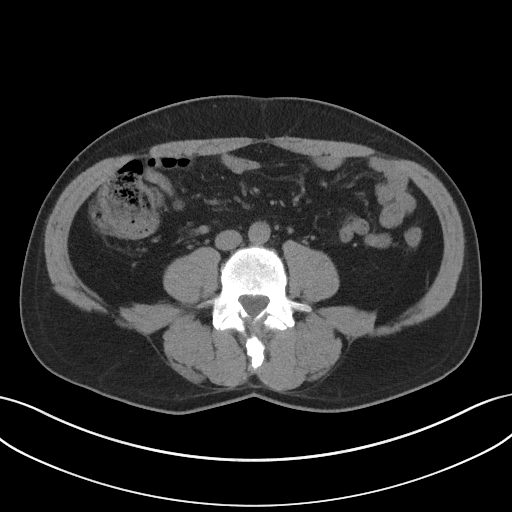
[im 61/104  soft-tissue]
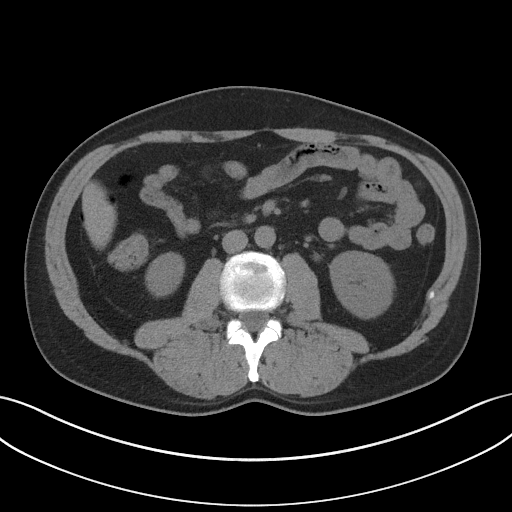
[im 69/104  soft-tissue]
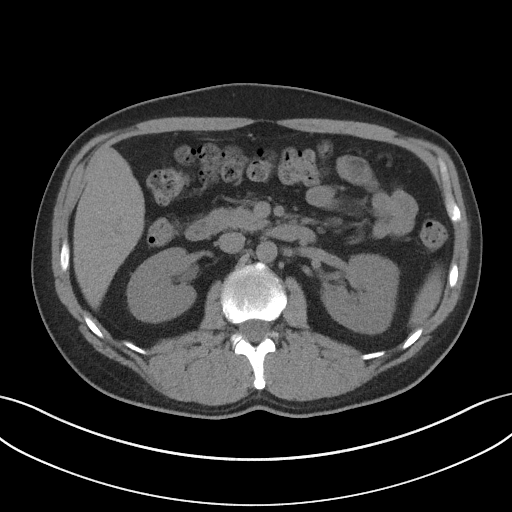
[im 69/104  bone]
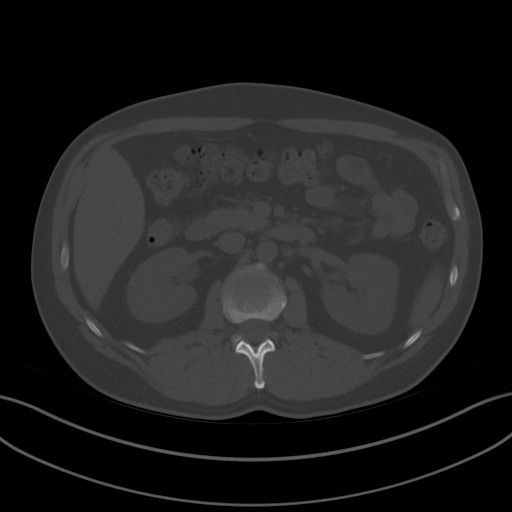
[im 73/104  soft-tissue]
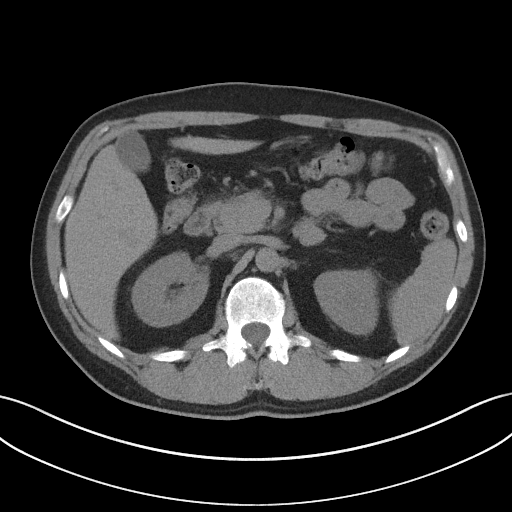
[im 82/104  soft-tissue]
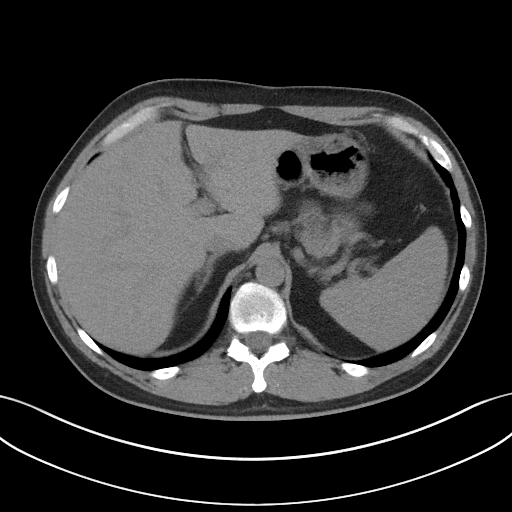
[im 91/104  soft-tissue]
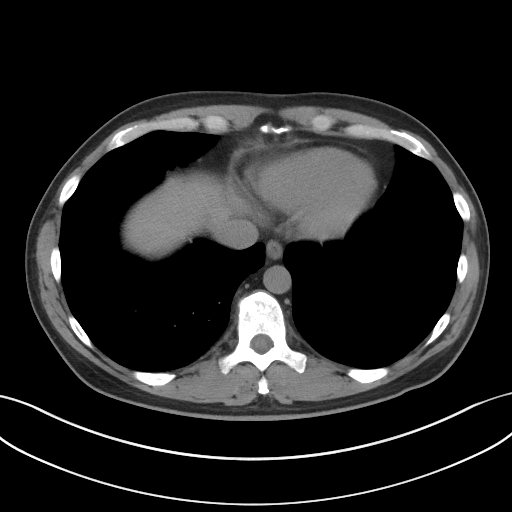
[im 99/104  soft-tissue]
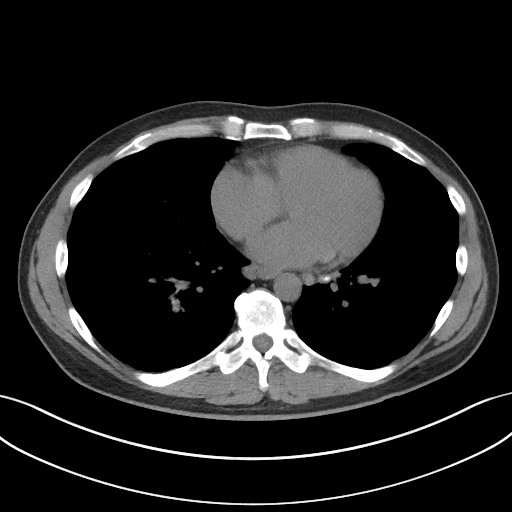

[Series 5: coronal · coronal · 0.80mm/px · 3 of 119 slices shown]
[im 40/119  soft-tissue]
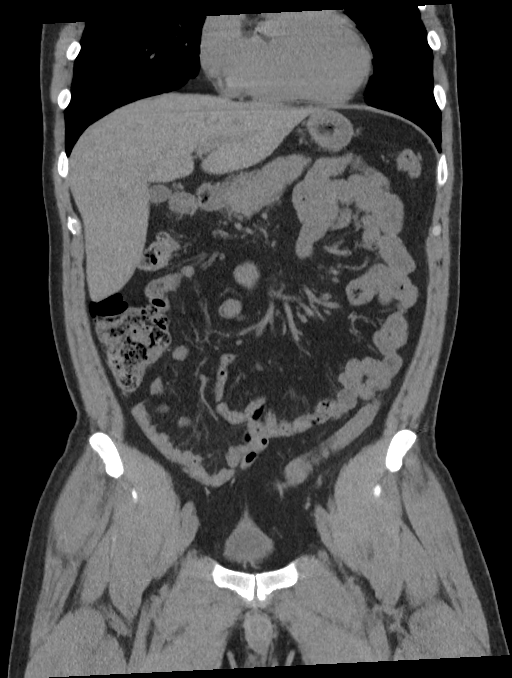
[im 53/119  soft-tissue]
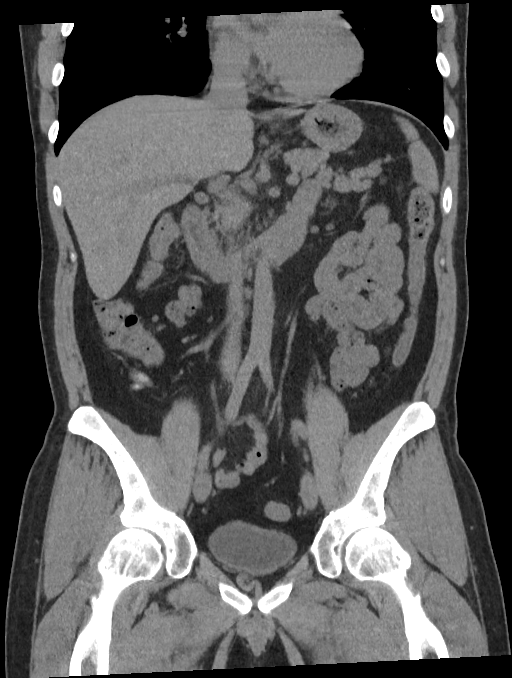
[im 66/119  soft-tissue]
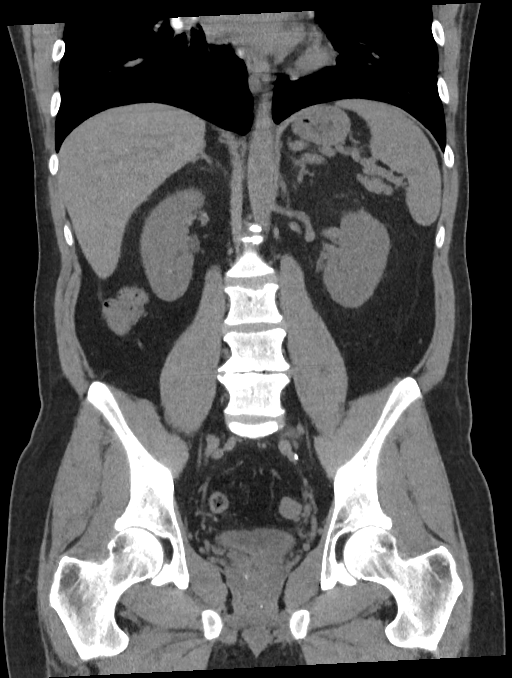

[16 of 46 positions shown; findings below may reference images not displayed]

FINDINGS: Lower chest: Several nodules in the left lower lobe measuring up to
10 mm are stable. There is 1 nodule in the posterior right lower
lobe measuring 4 mm which is new. See image 10 of series 4. Other
small nodules in the right lower lobe are unchanged.

Hepatobiliary: Unremarkable

Pancreas: Unremarkable

Spleen: Unremarkable

Adrenals/Urinary Tract: Right kidney and adrenal glands are within
normal limits. Stable simple cyst in the mid left kidney. This is
nonspecific. There is no hydronephrosis. Bladder is unremarkable.

Stomach/Bowel: Normal appendix. No obvious mass in the colon. Small
bowel is decompressed. Stomach is also decompressed.

Vascular/Lymphatic: No evidence of aortic aneurysm. No abnormal
retroperitoneal adenopathy.

Reproductive: Normal prostate.

Other: No free fluid.

Musculoskeletal: No vertebral compression deformity. Degenerative
disc disease at L4-5 and L5-S1.
IMPRESSION: No evidence of urinary obstruction or urinary calculus.

Small pulmonary nodules in the lung bases are noted. Most are
stable. There is a new 4 mm right lower lobe pulmonary nodule. No
follow-up needed if patient is low-risk. Non-contrast chest CT can
be considered in 12 months if patient is high-risk. This
recommendation follows the consensus statement: Guidelines for
Management of Incidental Pulmonary Nodules Detected on CT Images:

## 2020-12-23 ENCOUNTER — Ambulatory Visit: Payer: Self-pay | Admitting: Family Medicine

## 2021-01-26 ENCOUNTER — Ambulatory Visit (INDEPENDENT_AMBULATORY_CARE_PROVIDER_SITE_OTHER): Payer: BC Managed Care – PPO | Admitting: Family Medicine

## 2021-01-26 ENCOUNTER — Encounter: Payer: Self-pay | Admitting: Family Medicine

## 2021-01-26 ENCOUNTER — Other Ambulatory Visit: Payer: Self-pay

## 2021-01-26 VITALS — BP 120/68 | HR 67 | Temp 97.9°F | Resp 16 | Ht 71.0 in | Wt 160.0 lb

## 2021-01-26 DIAGNOSIS — Z23 Encounter for immunization: Secondary | ICD-10-CM | POA: Diagnosis not present

## 2021-01-26 DIAGNOSIS — G43109 Migraine with aura, not intractable, without status migrainosus: Secondary | ICD-10-CM

## 2021-01-26 DIAGNOSIS — Z0000 Encounter for general adult medical examination without abnormal findings: Secondary | ICD-10-CM

## 2021-01-26 DIAGNOSIS — Z114 Encounter for screening for human immunodeficiency virus [HIV]: Secondary | ICD-10-CM | POA: Diagnosis not present

## 2021-01-26 DIAGNOSIS — Z1159 Encounter for screening for other viral diseases: Secondary | ICD-10-CM | POA: Diagnosis not present

## 2021-01-26 DIAGNOSIS — Z1211 Encounter for screening for malignant neoplasm of colon: Secondary | ICD-10-CM | POA: Diagnosis not present

## 2021-01-26 DIAGNOSIS — R4189 Other symptoms and signs involving cognitive functions and awareness: Secondary | ICD-10-CM | POA: Diagnosis not present

## 2021-01-26 DIAGNOSIS — R634 Abnormal weight loss: Secondary | ICD-10-CM | POA: Diagnosis not present

## 2021-01-26 DIAGNOSIS — Z13220 Encounter for screening for lipoid disorders: Secondary | ICD-10-CM

## 2021-01-26 DIAGNOSIS — H6123 Impacted cerumen, bilateral: Secondary | ICD-10-CM

## 2021-01-26 NOTE — Progress Notes (Signed)
Name: Jorge Palmer   MRN: [id number]    DOB: June 23, 1970   Date:01/26/2021       Progress Note  Subjective  Chief Complaint  Annual Exam  HPI  Patient presents for annual CPE and follow up  Cognitive decline: going on for the past 4 years. He forgets names, also uses the incorrect hamper for dirty clothes. He has a family history of Alzheimer's dementia and also Parkinson's disease. Denies balanced problems.  Hearing loss: seems to be on both sides but worse on the left, he also has intermittent tinnitus that seems bilaterally. He states feels like something in his ear   Tennis elbow: he states he reached down to get a ball and through and immediately fell a pain going down from the shoulder to his right wrist, but now is from lateral elbow down to dorsal wrist, no redness or swelling. He has been taking Naproxen without much help. He stopped lifting weights due to pain  Migraine headache: he has since teenage years, about once every 3 months, takes Excedrin migraines, has aura ( scotomas a few minutes before episode) At times has photophobia, can affect his speech and vision, resolves with medication, has to take a nap at times, never tried triptans or newer medication. He was given Tylenol #3 prn as a teenager   Sarcoidosis: under the care of Dr. Marcos Eke, but is due for follow up. Denies sob, cough no wheezing. No rashes.   IPSS Questionnaire (AUA-7): Over the past month.   1)  How often have you had a sensation of not emptying your bladder completely after you finish urinating?  0 - Not at all  2)  How often have you had to urinate again less than two hours after you finished urinating? 0 - Not at all  3)  How often have you found you stopped and started again several times when you urinated?  0 - Not at all  4) How difficult have you found it to postpone urination?  0 - Not at all  5) How often have you had a weak urinary stream?  0 - Not at all  6) How often have you had to push  or strain to begin urination?  0 - Not at all  7) How many times did you most typically get up to urinate from the time you went to bed until the time you got up in the morning?  0 - None  Total score:  0-7 mildly symptomatic   8-19 moderately symptomatic   20-35 severely symptomatic     Diet: he significantly changed his diet March 22, his weight was 189.6 lbs at home and he wanted to go down to 175 lbs, but he is now down to 157 lbs ( at home ) he eliminated added sugar, still eats fruit , vegetables and has resume some complex carbohydrates like quinoa and brown rice Exercise: active around his house  Depression: phq 9 is negative Depression screen [facility] 2/9 01/26/2021 06/21/2017  Decreased Interest 0 0  Down, Depressed, Hopeless 0 0  PHQ - 2 Score 0 0  Altered sleeping 0 -  Tired, decreased energy 0 -  Change in appetite 0 -  Feeling bad or failure about yourself  0 -  Trouble concentrating 0 -  Moving slowly or fidgety/restless 0 -  Suicidal thoughts 0 -  PHQ-9 Score 0 -    Hypertension:  BP Readings from Last 3 Encounters:  01/26/21 120/68  09/19/19 130/72  02/10/19 121/72    Obesity: Wt Readings from Last 3 Encounters:  01/26/21 160 lb (72.6 kg)  09/19/19 186 lb 9.6 oz (84.6 kg)  02/10/19 185 lb (83.9 kg)   BMI Readings from Last 3 Encounters:  01/26/21 22.32 kg/m  09/19/19 26.03 kg/m  02/10/19 25.80 kg/m     Lipids:  Lab Results  Component Value Date   CHOL 192 06/28/2017   Lab Results  Component Value Date   HDL 44 06/28/2017   Lab Results  Component Value Date   LDLCALC 128 (H) 06/28/2017   Lab Results  Component Value Date   TRIG 97 06/28/2017   Lab Results  Component Value Date   CHOLHDL 4.4 06/28/2017   No results found for: LDLDIRECT Glucose:  Glucose, Bld  Date Value Ref Range Status  09/02/2018 139 (H) 70 - 99 mg/dL Final  53/66/4403 92 65 - 99 mg/dL Final    Comment:    .            Fasting reference interval .       Married STD testing and prevention (HIV/chl/gon/syphilis): N/A Hep C: N/A  Skin cancer: Discussed monitoring for atypical lesions Colorectal cancer: he agrees on having done  Prostate cancer: negative family history for prostate cancer     Lung cancer: Low Dose CT Chest recommended if Age 75-80 years, 30 pack-year currently smoking OR have quit w/in 15years. Patient does not qualify.   AAA: The USPSTF recommends one-time screening with ultrasonography in men ages 47 to 49 years who have ever smoked ECG:  12/19/13  Vaccines:   Pneumonia: today COVID-19 : discussed 4th booster  Flu: today   Advanced Care Planning: A voluntary discussion about advance care planning including the explanation and discussion of advance directives.  Discussed health care proxy and Living will, and the patient was able to identify a health care proxy as wife.  Patient does not have a living will at present time. If patient does have living will, I have requested they bring this to the clinic to be scanned in to their chart.  Patient Active Problem List   Diagnosis Date Noted   Perennial allergic rhinitis with seasonal variation 06/21/2017   Irritable bowel syndrome with diarrhea 06/21/2017   Irritable bowel syndrome (IBS) 06/21/2017   Daytime somnolence 02/17/2015   Lumbar spondylosis with myelopathy 01/16/2015   Pulmonary sarcoidosis (HCC) 12/19/2013    Past Surgical History:  Procedure Laterality Date   microlaminectomy L4-5  01/2016    Family History  Problem Relation Age of Onset   Cancer Maternal Grandfather        lung   Heart disease Maternal Grandfather    Cancer Maternal Grandmother        ovarian   Rheum arthritis Maternal Grandmother    Cancer Father        melanoma   Migraines Sister    Parkinson's disease Paternal Grandmother    Liver disease Paternal Grandfather     Social History   Socioeconomic History   Marital status: Married    Spouse name: Selena Batten    Number of  children: 1   Years of education: Not on file   Highest education level: Bachelor's degree (e.g., BA, AB, BS)  Occupational History   Occupation: area vice president     Comment: [employer].   Tobacco Use   Smoking status: Former    Packs/day: 0.50    Years: 6.00    Pack years: 3.00  Types: Cigarettes    Start date: 04/05/1997    Quit date: 12/20/2003    Years since quitting: 17.1   Smokeless tobacco: Never  Vaping Use   Vaping Use: Never used  Substance and Sexual Activity   Alcohol use: No    Comment: since 2017 never a heavy drinker   Drug use: No   Sexual activity: Yes    Partners: Female  Other Topics Concern   Not on file  Social History Narrative   Married, one child.    Social Determinants of Health   Financial Resource Strain: Low Risk    Difficulty of Paying Living Expenses: Not hard at all  Food Insecurity: No Food Insecurity   Worried About Programme researcher, broadcasting/film/video in the Last Year: Never true   Ran Out of Food in the Last Year: Never true  Transportation Needs: No Transportation Needs   Lack of Transportation (Medical): No   Lack of Transportation (Non-Medical): No  Physical Activity: Sufficiently Active   Days of Exercise per Week: 2 days   Minutes of Exercise per Session: 120 min  Stress: No Stress Concern Present   Feeling of Stress : Not at all  Social Connections: Socially Integrated   Frequency of Communication with Friends and Family: Twice a week   Frequency of Social Gatherings with Friends and Family: Once a week   Attends Religious Services: More than 4 times per year   Active Member of [employer] or Organizations: Yes   Attends Engineer, structural: More than 4 times per year   Marital Status: Married  Catering manager Violence: Not At Risk   Fear of Current or Ex-Partner: No   Emotionally Abused: No   Physically Abused: No   Sexually Abused: No     Current Outpatient Medications:    naproxen (NAPROSYN) 250 MG tablet, Take by  mouth 2 (two) times daily with a meal., Disp: , Rfl:   Allergies  Allergen Reactions   Sulfa Antibiotics     Rash-as an infant     ROS   Constitutional: Negative for fever or weight change.  Respiratory: Negative for cough and shortness of breath.   Cardiovascular: Negative for chest pain or palpitations.  Gastrointestinal: Negative for abdominal pain, no bowel changes.  Musculoskeletal: Negative for gait problem or joint swelling.  Skin: Negative for rash.  Neurological: Negative for dizziness or headache.  No other specific complaints in a complete review of systems (except as listed in HPI above).    Objective  Vitals:   01/26/21 1118  BP: 120/68  Pulse: 67  Resp: 16  Temp: 97.9 F (36.6 C)  SpO2: 99%  Weight: 160 lb (72.6 kg)  Height: 5\' 11"  (1.803 m)    Body mass index is 22.32 kg/m.  Physical Exam  Constitutional: Patient appears well-developed and well-nourished. No distress.  HENT: Head: Normocephalic and atraumatic. Ears: bilateral cerumen impactions Nose: not done  Mouth/Throat: not done  Eyes: Conjunctivae and EOM are normal. Pupils are equal, round, and reactive to light. No scleral icterus.  Neck: Normal range of motion. Neck supple. No JVD present. No thyromegaly present.  Cardiovascular: Normal rate, regular rhythm and normal heart sounds.  No murmur heard. No BLE edema. Pulmonary/Chest: Effort normal and breath sounds normal. No respiratory distress. Abdominal: Soft. Bowel sounds are normal, no distension. There is no tenderness. no masses MALE GENITALIA: Normal descended testes bilaterally, no masses palpated, weak lower abdominal wall, indirect hernias possible., no lesions, no discharge RECTAL: not  done  Musculoskeletal: Normal range of motion, no joint effusions. No gross deformities pain during palpation of lateral right epicondyle  Neurological: he is alert and oriented to person, place, and time. No cranial nerve deficit. Coordination,  balance, strength, speech and gait are normal.  Skin: Skin is warm and dry. No rash noted. No erythema.  Atypical moles on back, sees dermatologist yearly  Psychiatric: Patient has a normal mood and affect. behavior is normal. Judgment and thought content normal.   Fall Risk: Fall Risk  01/26/2021 06/21/2017  Falls in the past year? 0 No  Number falls in past yr: 0 -  Injury with Fall? 0 -  Risk for fall due to : No Fall Risks -  Follow up Falls prevention discussed -      Functional Status Survey: Is the patient deaf or have difficulty hearing?: No Does the patient have difficulty seeing, even when wearing glasses/contacts?: No Does the patient have difficulty concentrating, remembering, or making decisions?: No Does the patient have difficulty walking or climbing stairs?: No Does the patient have difficulty dressing or bathing?: No Does the patient have difficulty doing errands alone such as visiting a doctor's office or shopping?: No    Assessment & Plan  1. Well adult exam   2. Need for pneumococcal vaccine  - Pneumococcal conjugate vaccine 20-valent (Prevnar 20)  3. Needs flu shot  - Flu Vaccine QUAD 6+ mos PF IM (Fluarix Quad PF)  4. Encounter for screening for HIV  - Hemoglobin A1c  5. Need for hepatitis C screening test  - Hepatitis C antibody  6. Colon cancer screening  - Ambulatory referral to Gastroenterology  7. Weight loss  - COMPLETE METABOLIC PANEL WITH GFR - CBC with Differential/Platelet - Thyroid Panel With TSH - Vitamin B12 - VITAMIN D 25 Hydroxy (Vit-D Deficiency, Fractures) - HIV Antibody (routine testing w rflx)  8. Lipid screening  - Lipid panel  9. Cognitive decline  - Ambulatory referral to Neurology  10. Migraine with aura and without status migrainosus, not intractable  - Ambulatory referral to Neurology   11. Bilateral impacted cerumen  Verbal consent given Possible side effects discussed with patient Ears were   lavaged with warm water and peroxide  Patient tolerated procedure well No complications      -Prostate cancer screening and PSA options (with potential risks and benefits of testing vs not testing) were discussed along with recent recs/guidelines. -USPSTF grade A and B recommendations reviewed with patient; age-appropriate recommendations, preventive care, screening tests, etc discussed and encouraged; healthy living encouraged; see AVS for patient education given to patient -Discussed importance of 150 minutes of physical activity weekly, eat two servings of fish weekly, eat one serving of tree nuts ( cashews, pistachios, pecans, almonds.Marland Kitchen) every other day, eat 6 servings of fruit/vegetables daily and drink plenty of water and avoid sweet beverages.

## 2021-01-26 NOTE — Patient Instructions (Addendum)
Voltaren gel and try a tennis elbow brace   Preventive Care 48-48 Years Old, Male Preventive care refers to lifestyle choices and visits with your health care provider that can promote health and wellness. This includes: A yearly physical exam. This is also called an annual wellness visit. Regular dental and eye exams. Immunizations. Screening for certain conditions. Healthy lifestyle choices, such as: Eating a healthy diet. Getting regular exercise. Not using drugs or products that contain nicotine and tobacco. Limiting alcohol use. What can I expect for my preventive care visit? Physical exam Your health care provider will check your: Height and weight. These may be used to calculate your BMI (body mass index). BMI is a measurement that tells if you are at a healthy weight. Heart rate and blood pressure. Body temperature. Skin for abnormal spots. Counseling Your health care provider may ask you questions about your: Past medical problems. Family's medical history. Alcohol, tobacco, and drug use. Emotional well-being. Home life and relationship well-being. Sexual activity. Diet, exercise, and sleep habits. Work and work Statistician. Access to firearms. What immunizations do I need? Vaccines are usually given at various ages, according to a schedule. Your health care provider will recommend vaccines for you based on your age, medical history, and lifestyle or other factors, such as travel or where you work. What tests do I need? Blood tests Lipid and cholesterol levels. These may be checked every 5 years, or more often if you are over 48 years old. Hepatitis C test. Hepatitis B test. Screening Lung cancer screening. You may have this screening every year starting at age 48 if you have a 30-pack-year history of smoking and currently smoke or have quit within the past 15 years. Prostate cancer screening. Recommendations will vary depending on your family history and other  risks. Genital exam to check for testicular cancer or hernias. Colorectal cancer screening. All adults should have this screening starting at age 48 and continuing until age 67. Your health care provider may recommend screening at age 48 if you are at increased risk. You will have tests every 1-10 years, depending on your results and the type of screening test. Diabetes screening. This is done by checking your blood sugar (glucose) after you have not eaten for a while (fasting). You may have this done every 1-3 years. STD (sexually transmitted disease) testing, if you are at risk. Follow these instructions at home: Eating and drinking  Eat a diet that includes fresh fruits and vegetables, whole grains, lean protein, and low-fat dairy products. Take vitamin and mineral supplements as recommended by your health care provider. Do not drink alcohol if your health care provider tells you not to drink. If you drink alcohol: Limit how much you have to 0-2 drinks a day. Be aware of how much alcohol is in your drink. In the [place] one drink equals one 12 oz bottle of beer (355 mL), one 5 oz glass of wine (148 mL), or one 1 oz glass of hard liquor (44 mL). Lifestyle Take daily care of your teeth and gums. Brush your teeth every morning and night with fluoride toothpaste. Floss one time each day. Stay active. Exercise for at least 30 minutes 5 or more days each week. Do not use any products that contain nicotine or tobacco, such as cigarettes, e-cigarettes, and chewing tobacco. If you need help quitting, ask your health care provider. Do not use drugs. If you are sexually active, practice safe sex. Use a condom or other form of protection  to prevent STIs (sexually transmitted infections). If told by your health care provider, take low-dose aspirin daily starting at age 17. Find healthy ways to cope with stress, such as: Meditation, yoga, or listening to music. Journaling. Talking to a trusted  person. Spending time with friends and family. Safety Always wear your seat belt while driving or riding in a vehicle. Do not drive: If you have been drinking alcohol. Do not ride with someone who has been drinking. When you are tired or distracted. While texting. Wear a helmet and other protective equipment during sports activities. If you have firearms in your house, make sure you follow all gun safety procedures. What's next? Go to your health care provider once a year for an annual wellness visit. Ask your health care provider how often you should have your eyes and teeth checked. Stay up to date on all vaccines. This information is not intended to replace advice given to you by your health care provider. Make sure you discuss any questions you have with your health care provider. Document Revised: 05/30/2020 Document Reviewed: 03/16/2018 Elsevier Patient Education  2022 [place]. Tennis Elbow Tennis elbow (lateral epicondylitis) is inflammation of tendons in your outer forearm, near your elbow. Tendons are tissues that connect muscle to bone. When you have tennis elbow, inflammation affects the tendons that you use to bend your wrist and move your hand up. Inflammation occurs in the lower part of the upper arm bone (humerus), where the tendons connect to the bone (lateral epicondyle). Tennis elbow often affects people who play tennis, but anyone may get the condition from repeatedly extending the wrist or turning the forearm. What are the causes? This condition is usually caused by repeatedly extending the wrist, turning the forearm, and using the hands. It can result from sports or work that requires repetitive forearm movements. In some cases, it may be caused by a sudden injury. What increases the risk? You are more likely to develop tennis elbow if you play tennis or another racket sport. You also have a higher risk if you frequently use your hands for work. Besides people who  play tennis, others at greater risk include: People who use computers. Architect workers. People who work in [employer]. Musicians. Cooks. Cashiers. What are the signs or symptoms? Symptoms of this condition include: Pain and tenderness in the forearm and the outer part of the elbow. Pain may be felt only when using the arm, or it may be there all the time. A burning feeling that starts in the elbow and spreads down the forearm. A weak grip in the hand. How is this diagnosed? This condition is diagnosed based on your symptoms, your medical history, and a physical exam. You may also have X-rays or an MRI to: Confirm the diagnosis. Look for other issues. Check for tears in the ligaments, muscles, or tendons. How is this treated? Resting and icing your arm is often the first treatment. Your health care provider may also recommend: Medicines to reduce pain and inflammation. These may be in the form of a pill, topical gels, or shots of a steroid medicine (cortisone). An elbow strap to reduce stress on the area. Physical therapy. This may include massage or exercises or both. An elbow brace to restrict the movements that cause symptoms. If these treatments do not help relieve your symptoms, your health care provider may recommend surgery to remove damaged muscle and reattach healthy muscle to bone. Follow these instructions at home: If you have a brace  or strap: Wear the brace or strap as told by your health care provider. Remove it only as told by your health care provider. Check the skin around the brace or strap every day. Tell your health care provider about any concerns. Loosen the brace if your fingers tingle, become numb, or turn cold and blue. Keep the brace clean. If the brace or strap is not waterproof: Do not let it get wet. Cover it with a watertight covering when you take a bath or a shower. Managing pain, stiffness, and swelling  If directed, put ice on the injured area.  To do this: If you have a removable brace or strap, remove it as told by your health care provider. Put ice in a plastic bag. Place a towel between your skin and the bag. Leave the ice on for 20 minutes, 2-3 times a day. Remove the ice if your skin turns bright red. This is very important. If you cannot feel pain, heat, or cold, you have a greater risk of damage to the area. Move your fingers often to reduce stiffness and swelling. Activity Rest your elbow and wrist and avoid activities that cause symptoms as told by your health care provider. Do physical therapy exercises as told by your health care provider. If you lift an object, lift it with your palm facing up. This reduces stress on your elbow. Lifestyle If your tennis elbow is caused by sports, check your equipment and make sure that: You use it correctly. It is good match for you. If your tennis elbow is caused by work or computer use, take frequent breaks to stretch your arm. Talk with your employer about ways to manage your condition at work. General instructions Take over-the-counter and prescription medicines only as told by your health care provider. Do not use any products that contain nicotine or tobacco. These products include cigarettes, chewing tobacco, and vaping devices, such as e-cigarettes. If you need help quitting, ask your health care provider. Keep all follow-up visits. This is important. How is this prevented? Before and after activity: Warm up and stretch before being active. Cool down and stretch after being active. Give your body time to rest between periods of activity. During activity: Make sure to use equipment that fits you. If you play tennis, put power in your stroke with your lower body. Avoid using your arm only. Maintain physical fitness, including: Strength. Flexibility. Endurance. Do exercises to strengthen the forearm muscles. Contact a health care provider if: You have pain that gets worse  or does not get better with treatment. You have numbness or weakness in your forearm, hand, or fingers. Get help right away if: Your pain is severe. You cannot move your wrist. Summary Tennis elbow (lateral epicondylitis) is inflammation of tendons in your outer forearm, near your elbow. Common symptoms include pain and tenderness in your forearm and the outer part of your elbow. This condition is usually caused by repeatedly extending your wrist, turning your forearm, and using your hands. The first treatment is often resting and icing your arm to relieve symptoms. Further treatment may include taking medicine, getting physical therapy, wearing a brace or strap, or having surgery. This information is not intended to replace advice given to you by your health care provider. Make sure you discuss any questions you have with your health care provider. Document Revised: 10/02/2019 Document Reviewed: 10/02/2019 Elsevier Patient Education  [place].

## 2021-01-27 ENCOUNTER — Other Ambulatory Visit: Payer: Self-pay

## 2021-01-27 DIAGNOSIS — Z1211 Encounter for screening for malignant neoplasm of colon: Secondary | ICD-10-CM

## 2021-01-27 LAB — HEMOGLOBIN A1C
Hgb A1c MFr Bld: 5.2 % of total Hgb (ref <5.7)
Mean Plasma Glucose: 103 mg/dL
eAG (mmol/L): 5.7 mmol/L

## 2021-01-27 LAB — CBC WITH DIFFERENTIAL/PLATELET
Absolute Monocytes: 422 cells/uL (ref 200–950)
Basophils Absolute: 70 cells/uL (ref 0–200)
Basophils Relative: 1.1 %
Eosinophils Absolute: 262 cells/uL (ref 15–500)
Eosinophils Relative: 4.1 %
HCT: 46.7 % (ref 38.5–50.0)
Hemoglobin: 15.3 g/dL (ref 13.2–17.1)
Lymphs Abs: 1126 cells/uL (ref 850–3900)
MCH: 30.5 pg (ref 27.0–33.0)
MCHC: 32.8 g/dL (ref 32.0–36.0)
MCV: 93 fL (ref 80.0–100.0)
MPV: 11.2 fL (ref 7.5–12.5)
Monocytes Relative: 6.6 %
Neutro Abs: 4518 cells/uL (ref 1500–7800)
Neutrophils Relative %: 70.6 %
Platelets: 232 10*3/uL (ref 140–400)
RBC: 5.02 10*6/uL (ref 4.20–5.80)
RDW: 12.2 % (ref 11.0–15.0)
Total Lymphocyte: 17.6 %
WBC: 6.4 10*3/uL (ref 3.8–10.8)

## 2021-01-27 LAB — LIPID PANEL
Cholesterol: 208 mg/dL — ABNORMAL HIGH (ref <200)
HDL: 50 mg/dL (ref >=40)
LDL Cholesterol (Calc): 131 mg/dL (calc) — ABNORMAL HIGH
Non-HDL Cholesterol (Calc): 158 mg/dL (calc) — ABNORMAL HIGH (ref <130)
Total CHOL/HDL Ratio: 4.2 (calc) (ref <5.0)
Triglycerides: 158 mg/dL — ABNORMAL HIGH (ref <150)

## 2021-01-27 LAB — COMPLETE METABOLIC PANEL WITH GFR
AG Ratio: 1.9 (calc) (ref 1.0–2.5)
ALT: 15 U/L (ref 9–46)
AST: 17 U/L (ref 10–40)
Albumin: 4.5 g/dL (ref 3.6–5.1)
Alkaline phosphatase (APISO): 47 U/L (ref 36–130)
BUN: 22 mg/dL (ref 7–25)
CO2: 30 mmol/L (ref 20–32)
Calcium: 9.4 mg/dL (ref 8.6–10.3)
Chloride: 102 mmol/L (ref 98–110)
Creat: 0.88 mg/dL (ref 0.60–1.29)
Globulin: 2.4 g/dL (calc) (ref 1.9–3.7)
Glucose, Bld: 91 mg/dL (ref 65–99)
Potassium: 4.3 mmol/L (ref 3.5–5.3)
Sodium: 140 mmol/L (ref 135–146)
Total Bilirubin: 0.9 mg/dL (ref 0.2–1.2)
Total Protein: 6.9 g/dL (ref 6.1–8.1)
eGFR: 106 mL/min/{1.73_m2} (ref >=60)

## 2021-01-27 LAB — THYROID PANEL WITH TSH
Free Thyroxine Index: 2.6 (ref 1.4–3.8)
T3 Uptake: 33 % (ref 22–35)
T4, Total: 7.8 ug/dL (ref 4.9–10.5)
TSH: 2.26 mIU/L (ref 0.40–4.50)

## 2021-01-27 LAB — HEPATITIS C ANTIBODY
Hepatitis C Ab: NONREACTIVE
SIGNAL TO CUT-OFF: 0.23 (ref <1.00)

## 2021-01-27 LAB — HIV ANTIBODY (ROUTINE TESTING W REFLEX): HIV 1&2 Ab, 4th Generation: NONREACTIVE

## 2021-01-27 LAB — VITAMIN D 25 HYDROXY (VIT D DEFICIENCY, FRACTURES): Vit D, 25-Hydroxy: 64 ng/mL (ref 30–100)

## 2021-01-27 LAB — VITAMIN B12: Vitamin B-12: 415 pg/mL (ref 200–1100)

## 2021-01-27 MED ORDER — PEG 3350-KCL-NA BICARB-NACL 420 G PO SOLR: 4000.0000 mL | Freq: Once | ORAL | 0 refills | Status: AC

## 2021-01-27 NOTE — Progress Notes (Signed)
Gastroenterology Pre-Procedure Review  Request Date: 03/10/2021 Requesting Physician: Dr. Vicente Males   PATIENT REVIEW QUESTIONS: The patient responded to the following health history questions as indicated:    1. Are you having any GI issues? no 2. Do you have a personal history of Polyps? no 3. Do you have a family history of Colon Cancer or Polyps? no 4. Diabetes Mellitus? no 5. Joint replacements in the past 12 months?no 6. Major health problems in the past 3 months?no 7. Any artificial heart valves, MVP, or defibrillator?no    MEDICATIONS & ALLERGIES:    Patient reports the following regarding taking any anticoagulation/antiplatelet therapy:   Plavix, Coumadin, Eliquis, Xarelto, Lovenox, Pradaxa, Brilinta, or Effient? no Aspirin? no  Patient confirms/reports the following medications:  Current Outpatient Medications  Medication Sig Dispense Refill   naproxen (NAPROSYN) 250 MG tablet Take by mouth 2 (two) times daily with a meal.     No current facility-administered medications for this visit.    Patient confirms/reports the following allergies:  Allergies  Allergen Reactions   Sulfa Antibiotics     Rash-as an infant    No orders of the defined types were placed in this encounter.   AUTHORIZATION INFORMATION Primary Insurance: 1D#: Group #:  Secondary Insurance: 1D#: Group #:  SCHEDULE INFORMATION: Date: 03/10/2021 Time: Location: [facility]

## 2021-03-09 ENCOUNTER — Encounter: Payer: Self-pay | Admitting: Gastroenterology

## 2021-03-10 ENCOUNTER — Encounter
Admission: RE | Disposition: A | Discharge status: Home / Self Care | Payer: Self-pay | Source: Home / Self Care | Attending: Gastroenterology

## 2021-03-10 ENCOUNTER — Encounter: Payer: Self-pay | Admitting: Gastroenterology

## 2021-03-10 ENCOUNTER — Ambulatory Visit
Admission: RE | Admit: 2021-03-10 | Discharge: 2021-03-10 | Disposition: A | Discharge status: Home / Self Care | Payer: BC Managed Care – PPO | Attending: Gastroenterology | Admitting: Gastroenterology

## 2021-03-10 ENCOUNTER — Other Ambulatory Visit: Payer: Self-pay

## 2021-03-10 ENCOUNTER — Ambulatory Visit: Payer: BC Managed Care – PPO | Admitting: Anesthesiology

## 2021-03-10 DIAGNOSIS — Z87891 Personal history of nicotine dependence: Secondary | ICD-10-CM | POA: Diagnosis not present

## 2021-03-10 DIAGNOSIS — D869 Sarcoidosis, unspecified: Secondary | ICD-10-CM | POA: Insufficient documentation

## 2021-03-10 DIAGNOSIS — K5790 Diverticulosis of intestine, part unspecified, without perforation or abscess without bleeding: Secondary | ICD-10-CM | POA: Diagnosis not present

## 2021-03-10 DIAGNOSIS — D125 Benign neoplasm of sigmoid colon: Secondary | ICD-10-CM | POA: Insufficient documentation

## 2021-03-10 DIAGNOSIS — K5730 Diverticulosis of large intestine without perforation or abscess without bleeding: Secondary | ICD-10-CM | POA: Diagnosis not present

## 2021-03-10 DIAGNOSIS — K635 Polyp of colon: Secondary | ICD-10-CM | POA: Diagnosis not present

## 2021-03-10 DIAGNOSIS — Z1211 Encounter for screening for malignant neoplasm of colon: Secondary | ICD-10-CM | POA: Insufficient documentation

## 2021-03-10 HISTORY — PX: COLONOSCOPY WITH PROPOFOL: SHX5780

## 2021-03-10 SURGERY — COLONOSCOPY WITH PROPOFOL
Anesthesia: General

## 2021-03-10 MED ORDER — PROPOFOL 500 MG/50ML IV EMUL
INTRAVENOUS | Status: DC | PRN
  Administered 2021-03-10: 140 ug/kg/min via INTRAVENOUS

## 2021-03-10 MED ORDER — LIDOCAINE HCL (PF) 2 % IJ SOLN
INTRAMUSCULAR | Status: AC
  Filled 2021-03-10: qty 5

## 2021-03-10 MED ORDER — PROPOFOL 10 MG/ML IV BOLUS
INTRAVENOUS | Status: AC
  Filled 2021-03-10: qty 20

## 2021-03-10 MED ORDER — PROPOFOL 500 MG/50ML IV EMUL
INTRAVENOUS | Status: AC
  Filled 2021-03-10: qty 50

## 2021-03-10 MED ORDER — LIDOCAINE HCL (CARDIAC) PF 100 MG/5ML IV SOSY
PREFILLED_SYRINGE | INTRAVENOUS | Status: DC | PRN
  Administered 2021-03-10: 50 mg via INTRAVENOUS

## 2021-03-10 MED ORDER — EPHEDRINE 5 MG/ML INJ
INTRAVENOUS | Status: AC
  Filled 2021-03-10: qty 5

## 2021-03-10 MED ORDER — SODIUM CHLORIDE 0.9 % IV SOLN: INTRAVENOUS | Status: DC

## 2021-03-10 MED ORDER — PROPOFOL 10 MG/ML IV BOLUS
INTRAVENOUS | Status: DC | PRN
  Administered 2021-03-10: 80 mg via INTRAVENOUS

## 2021-03-10 NOTE — H&P (Signed)
Jorge Bellows, MD [address] [place], [place], [place], [address] [id number], [place], [place], [place], [address] Phone: [phone]  Fax: [phone]  Primary Care Physician:  Jorge Sizer, MD   Pre-Procedure History & Physical: HPI:  Jorge Palmer is a 50 y.o. male is here for an colonoscopy.   Past Medical History:  Diagnosis Date   Sarcoidosis     Past Surgical History:  Procedure Laterality Date   microlaminectomy L4-5  01/2016    Prior to Admission medications   Medication Sig Start Date End Date Taking? Authorizing Provider  naproxen (NAPROSYN) 250 MG tablet Take by mouth 2 (two) times daily with a meal.   Yes [provider]    Allergies as of 01/27/2021 - Review Complete 01/27/2021  Allergen Reaction Noted   Sulfa antibiotics  12/19/2013    Family History  Problem Relation Age of Onset   Cancer Maternal Grandfather        lung   Heart disease Maternal Grandfather    Cancer Maternal Grandmother        ovarian   Rheum arthritis Maternal Grandmother    Cancer Father        melanoma   Migraines Sister    Parkinson's disease Paternal Grandmother    Liver disease Paternal Grandfather     Social History   Socioeconomic History   Marital status: Married    Spouse name: Jorge Palmer    Number of children: 1   Years of education: Not on file   Highest education level: Bachelor's degree (e.g., BA, AB, BS)  Occupational History   Occupation: area vice president     Comment: [employer].   Tobacco Use   Smoking status: Former    Packs/day: 0.50    Years: 6.00    Pack years: 3.00    Types: Cigarettes    Start date: 04/05/1997    Quit date: 12/20/2003    Years since quitting: 17.2   Smokeless tobacco: Never  Vaping Use   Vaping Use: Never used  Substance and Sexual Activity   Alcohol use: No    Comment: since 2017 never a heavy drinker   Drug use: No   Sexual activity: Yes    Partners: Female  Other Topics Concern    Not on file  Social History Narrative   Married, one child.    Social Determinants of Health   Financial Resource Strain: Low Risk    Difficulty of Paying Living Expenses: Not hard at all  Food Insecurity: No Food Insecurity   Worried About Charity fundraiser in the Last Year: Never true   [place] in the Last Year: Never true  Transportation Needs: No Transportation Needs   Lack of Transportation (Medical): No   Lack of Transportation (Non-Medical): No  Physical Activity: Sufficiently Active   Days of Exercise per Week: 2 days   Minutes of Exercise per Session: 120 min  Stress: No Stress Concern Present   Feeling of Stress : Not at all  Social Connections: Socially Integrated   Frequency of Communication with Friends and Family: Twice a week   Frequency of Social Gatherings with Friends and Family: Once a week   Attends Religious Services: More than 4 times per year   Active Member of [employer] or Organizations: Yes   Attends Music therapist: More than 4 times per year   Marital Status: Married  Human resources officer Violence: Not At Risk  Fear of Current or Ex-Partner: No   Emotionally Abused: No   Physically Abused: No   Sexually Abused: No    Review of Systems: See HPI, otherwise negative ROS  Physical Exam: BP 116/71   Pulse (!) 56   Temp (!) 97.3 F (36.3 C) (Temporal)   Resp 17   Ht 5\' 11"  (1.803 m)   Wt 72.6 kg   SpO2 100%   BMI 22.32 kg/m  General:   Alert,  pleasant and cooperative in NAD Head:  Normocephalic and atraumatic. Neck:  Supple; no masses or thyromegaly. Lungs:  Clear throughout to auscultation, normal respiratory effort.    Heart:  +S1, +S2, Regular rate and rhythm, No edema. Abdomen:  Soft, nontender and nondistended. Normal bowel sounds, without guarding, and without rebound.   Neurologic:  Alert and  oriented x4;  grossly normal neurologically.  Impression/Plan: Jorge Palmer is here for an colonoscopy to be performed  for Screening colonoscopy average risk   Risks, benefits, limitations, and alternatives regarding  colonoscopy have been reviewed with the patient.  Questions have been answered.  All parties agreeable.   Jorge Bellows, MD  03/10/2021, 9:23 AM

## 2021-03-10 NOTE — Anesthesia Preprocedure Evaluation (Addendum)
Anesthesia Evaluation  Patient identified by MRN, date of birth, ID band Patient awake    Reviewed: Allergy & Precautions, NPO status , Patient's Chart, lab work & pertinent test results  History of Anesthesia Complications Negative for: history of anesthetic complications  Airway Mallampati: II  TM Distance: <3 FB Neck ROM: Full   Comment: beard Dental   Pulmonary former smoker,    Sarcoidosis, currently asymptomatic    Pulmonary exam normal        Cardiovascular negative cardio ROS Normal cardiovascular exam     Neuro/Psych  Headaches,   LBP negative psych ROS   GI/Hepatic negative GI ROS, Neg liver ROS,   Endo/Other  negative endocrine ROS  Renal/GU negative Renal ROS  negative genitourinary   Musculoskeletal negative musculoskeletal ROS (+)   Abdominal   Peds  Hematology negative hematology ROS (+)   Anesthesia Other Findings 2017 Miller grade III view  Reproductive/Obstetrics                            Anesthesia Physical Anesthesia Plan  ASA: 2  Anesthesia Plan: General   Post-op Pain Management:    Induction:   PONV Risk Score and Plan:   Airway Management Planned: Natural Airway  Additional Equipment:   Intra-op Plan:   Post-operative Plan:   Informed Consent: I have reviewed the patients History and Physical, chart, labs and discussed the procedure including the risks, benefits and alternatives for the proposed anesthesia with the patient or authorized representative who has indicated his/her understanding and acceptance.       Plan Discussed with: CRNA  Anesthesia Plan Comments:         Anesthesia Quick Evaluation

## 2021-03-10 NOTE — Transfer of Care (Signed)
Immediate Anesthesia Transfer of Care Note  Patient: Jorge Palmer  Procedure(s) Performed: COLONOSCOPY WITH PROPOFOL  Patient Location: PACU  Anesthesia Type:General  Level of Consciousness: sedated  Airway & Oxygen Therapy: Patient Spontanous Breathing  Post-op Assessment: Report given to RN and Post -op Vital signs reviewed and stable  Post vital signs: Reviewed and stable  Last Vitals:  Vitals Value Taken Time  BP 98/62 03/10/21 1001  Temp    Pulse 66 03/10/21 1001  Resp 10 03/10/21 1001  SpO2 99 % 03/10/21 1001  Vitals shown include unvalidated device data.  Last Pain:  Vitals:   03/10/21 0900  TempSrc: Temporal  PainSc: 0-No pain         Complications: No notable events documented.

## 2021-03-10 NOTE — Op Note (Signed)
[facility] Gastroenterology Patient Name: Jorge Palmer Procedure Date: 03/10/2021 9:28 AM MRN: [id number] Account #: [id number] Date of Birth: [birth year] Admit Type: Outpatient Age: 50 Room: [facility] ENDO ROOM 2 Gender: Male Note Status: Finalized Instrument Name: Prentice Docker [id number] Procedure:             Colonoscopy Indications:           Screening for colorectal malignant neoplasm Providers:             Wyline Mood MD, MD Referring MD:          Onnie Boer. Sowles, MD (Referring MD) Medicines:             Monitored Anesthesia Care Complications:         No immediate complications. Procedure:             Pre-Anesthesia Assessment:                        - Prior to the procedure, a History and Physical was                         performed, and patient medications, allergies and                         sensitivities were reviewed. The patient's tolerance                         of previous anesthesia was reviewed.                        - The risks and benefits of the procedure and the                         sedation options and risks were discussed with the                         patient. All questions were answered and informed                         consent was obtained.                        - ASA Grade Assessment: II - A patient with mild                         systemic disease.                        After obtaining informed consent, the colonoscope was                         passed under direct vision. Throughout the procedure,                         the patient's blood pressure, pulse, and oxygen                         saturations were monitored continuously. The                         Colonoscope was  introduced through the anus and                         advanced to the the cecum, identified by the                         appendiceal orifice. The colonoscopy was performed                         with ease. The patient tolerated the procedure well.                          The quality of the bowel preparation was good. Findings:      The perianal and digital rectal examinations were normal.      A 7 mm polyp was found in the sigmoid colon. The polyp was sessile. The       polyp was removed with a cold snare. Resection and retrieval were       complete.      Multiple small-mouthed diverticula were found in the sigmoid colon.      The exam was otherwise without abnormality on direct and retroflexion       views. Impression:            - One 7 mm polyp in the sigmoid colon, removed with a                         cold snare. Resected and retrieved.                        - Diverticulosis in the sigmoid colon.                        - The examination was otherwise normal on direct and                         retroflexion views. Recommendation:        - Discharge patient to home (with escort).                        - Resume previous diet.                        - Continue present medications.                        - Await pathology results.                        - Repeat colonoscopy for surveillance based on                         pathology results. Procedure Code(s):     --- Professional ---                        [phone], Colonoscopy, flexible; with removal of                         tumor(s), polyp(s), or other lesion(s) by snare  technique Diagnosis Code(s):     --- Professional ---                        Z12.11, Encounter for screening for malignant neoplasm                         of colon                        K63.5, Polyp of colon                        K57.30, Diverticulosis of large intestine without                         perforation or abscess without bleeding CPT copyright 2019 American Medical Association. All rights reserved. The codes documented in this report are preliminary and upon coder review may  be revised to meet current compliance requirements. Wyline Mood, MD Wyline Mood MD,  MD 03/10/2021 9:59:34 AM This report has been signed electronically. Number of Addenda: 0 Note Initiated On: 03/10/2021 9:28 AM Scope Withdrawal Time: 0 hours 10 minutes 12 seconds  Total Procedure Duration: 0 hours 13 minutes 10 seconds  Estimated Blood Loss:  Estimated blood loss: none.      [facility]

## 2021-03-10 NOTE — Anesthesia Postprocedure Evaluation (Signed)
Anesthesia Post Note  Patient: Jorge Palmer  Procedure(s) Performed: COLONOSCOPY WITH PROPOFOL  Patient location during evaluation: PACU Anesthesia Type: General Level of consciousness: awake and alert Pain management: pain level controlled Vital Signs Assessment: post-procedure vital signs reviewed and stable Respiratory status: spontaneous breathing, nonlabored ventilation, respiratory function stable and patient connected to nasal cannula oxygen Cardiovascular status: blood pressure returned to baseline and stable Postop Assessment: no apparent nausea or vomiting Anesthetic complications: no   No notable events documented.   Last Vitals:  Vitals:   03/10/21 1002 03/10/21 1022  BP: 98/62 112/69  Pulse: 63 (!) 51  Resp: 17 13  Temp:    SpO2: 99% 100%    Last Pain:  Vitals:   03/10/21 1032  TempSrc:   PainSc: 0-No pain                 [place]

## 2021-03-11 ENCOUNTER — Encounter: Payer: Self-pay | Admitting: Gastroenterology

## 2021-03-11 LAB — SURGICAL PATHOLOGY

## 2021-04-14 DIAGNOSIS — G3184 Mild cognitive impairment, so stated: Secondary | ICD-10-CM | POA: Diagnosis not present

## 2021-04-14 DIAGNOSIS — G43019 Migraine without aura, intractable, without status migrainosus: Secondary | ICD-10-CM | POA: Diagnosis not present

## 2021-04-14 DIAGNOSIS — D869 Sarcoidosis, unspecified: Secondary | ICD-10-CM | POA: Diagnosis not present

## 2021-04-16 ENCOUNTER — Other Ambulatory Visit: Payer: Self-pay | Admitting: Neurology

## 2021-04-16 DIAGNOSIS — G3184 Mild cognitive impairment, so stated: Secondary | ICD-10-CM

## 2021-04-24 ENCOUNTER — Ambulatory Visit: Payer: BC Managed Care – PPO

## 2021-05-26 ENCOUNTER — Other Ambulatory Visit: Payer: Self-pay

## 2021-05-26 ENCOUNTER — Ambulatory Visit
Admission: RE | Admit: 2021-05-26 | Discharge: 2021-05-26 | Disposition: A | Discharge status: Home / Self Care | Payer: BC Managed Care – PPO | Source: Ambulatory Visit | Attending: Neurology | Admitting: Neurology

## 2021-05-26 DIAGNOSIS — G3184 Mild cognitive impairment, so stated: Secondary | ICD-10-CM | POA: Diagnosis not present

## 2021-05-26 DIAGNOSIS — G43909 Migraine, unspecified, not intractable, without status migrainosus: Secondary | ICD-10-CM | POA: Diagnosis not present

## 2021-05-26 MED ORDER — GADOBUTROL 1 MMOL/ML IV SOLN
7.0000 mL | Freq: Once | INTRAVENOUS | Status: AC | PRN
  Administered 2021-05-26: 7 mL via INTRAVENOUS

## 2021-10-16 DIAGNOSIS — D2271 Melanocytic nevi of right lower limb, including hip: Secondary | ICD-10-CM | POA: Diagnosis not present

## 2021-10-16 DIAGNOSIS — D2261 Melanocytic nevi of right upper limb, including shoulder: Secondary | ICD-10-CM | POA: Diagnosis not present

## 2021-10-16 DIAGNOSIS — D2262 Melanocytic nevi of left upper limb, including shoulder: Secondary | ICD-10-CM | POA: Diagnosis not present

## 2021-10-16 DIAGNOSIS — D225 Melanocytic nevi of trunk: Secondary | ICD-10-CM | POA: Diagnosis not present

## 2022-02-01 ENCOUNTER — Ambulatory Visit (INDEPENDENT_AMBULATORY_CARE_PROVIDER_SITE_OTHER): Payer: BC Managed Care – PPO | Admitting: Family Medicine

## 2022-02-01 ENCOUNTER — Ambulatory Visit
Admission: RE | Admit: 2022-02-01 | Discharge: 2022-02-01 | Disposition: A | Discharge status: Home / Self Care | Payer: BC Managed Care – PPO | Source: Ambulatory Visit | Attending: Family Medicine | Admitting: Family Medicine

## 2022-02-01 ENCOUNTER — Encounter: Payer: Self-pay | Admitting: Family Medicine

## 2022-02-01 ENCOUNTER — Ambulatory Visit
Admission: RE | Admit: 2022-02-01 | Discharge: 2022-02-01 | Disposition: A | Discharge status: Home / Self Care | Payer: BC Managed Care – PPO | Attending: Family Medicine | Admitting: Family Medicine

## 2022-02-01 VITALS — BP 116/70 | HR 60 | Temp 98.1°F | Resp 14 | Ht 71.0 in | Wt 176.7 lb

## 2022-02-01 VITALS — BP 110/70 | HR 52 | Ht 71.0 in | Wt 176.0 lb

## 2022-02-01 DIAGNOSIS — M25561 Pain in right knee: Secondary | ICD-10-CM | POA: Diagnosis not present

## 2022-02-01 DIAGNOSIS — Z0000 Encounter for general adult medical examination without abnormal findings: Secondary | ICD-10-CM

## 2022-02-01 DIAGNOSIS — E538 Deficiency of other specified B group vitamins: Secondary | ICD-10-CM | POA: Diagnosis not present

## 2022-02-01 DIAGNOSIS — M7631 Iliotibial band syndrome, right leg: Secondary | ICD-10-CM | POA: Insufficient documentation

## 2022-02-01 DIAGNOSIS — R399 Unspecified symptoms and signs involving the genitourinary system: Secondary | ICD-10-CM | POA: Diagnosis not present

## 2022-02-01 DIAGNOSIS — L659 Nonscarring hair loss, unspecified: Secondary | ICD-10-CM

## 2022-02-01 DIAGNOSIS — Z23 Encounter for immunization: Secondary | ICD-10-CM

## 2022-02-01 DIAGNOSIS — E785 Hyperlipidemia, unspecified: Secondary | ICD-10-CM | POA: Diagnosis not present

## 2022-02-01 DIAGNOSIS — M1611 Unilateral primary osteoarthritis, right hip: Secondary | ICD-10-CM | POA: Diagnosis not present

## 2022-02-01 DIAGNOSIS — D860 Sarcoidosis of lung: Secondary | ICD-10-CM

## 2022-02-01 MED ORDER — MELOXICAM 15 MG PO TABS: 15.0000 mg | ORAL_TABLET | Freq: Every day | ORAL | 0 refills | Status: DC

## 2022-02-01 NOTE — Patient Instructions (Addendum)
-   Obtain x-rays today - Dose meloxicam daily x2 weeks - If effective, contact our office for refills - If ineffective, contact our office for alternate NSAID Rx - Referral coordinator will contact you to schedule physical therapy - Return for follow-up in 6 weeks, contact our office for any questions between now and then

## 2022-02-01 NOTE — Patient Instructions (Signed)
Dr. Rosette Reveal - sports medicine in [place].

## 2022-02-01 NOTE — Progress Notes (Signed)
Primary Care / Sports Medicine Office Visit  Patient Information:  Patient ID: Jorge Palmer, male DOB: 02-Jul-1970 Age: 51 y.o. MRN: [id number]   Jorge Palmer is a pleasant 51 y.o. male presenting with the following:  Chief Complaint  Patient presents with   Knee Pain    8-10 months pain, no known injury. Motrin at night that helped, was told today to stop. Was taking 800mg .    Hip Pain    8-10 months pain, no known injury. Motrin at night that helped, was told today to stop. Was taking 800mg .     Vitals:   02/01/22 1553  BP: 110/70  Pulse: (!) 52  SpO2: 97%   Vitals:   02/01/22 1553  Weight: 176 lb (79.8 kg)  Height: 5\' 11"  (1.803 m)   Body mass index is 24.55 kg/m.  No results found.   Independent interpretation of notes and tests performed by another provider:   None  Procedures performed:   None  Pertinent History, Exam, Impression, and Recommendations:   Problem List Items Addressed This Visit       Musculoskeletal and Integument   It band syndrome, right - Primary    Roughly 1 year of atraumatic right lateral hip and right lateral knee pain, in the setting of lumbar spondylosis with myelopathy, has been treating this with ibuprofen 800 mg every other day with improvement.  Was advised by his PCP to discontinue long-term NSAID usage, recent renal function reviewed.  He denies any paresthesias distal to the knee, has noted intermittent clicks and pops from the knee, and pain is worse with squatting, bending.  Examination of the right hip with faint tenderness at the right greater trochanter, positive Ober's test, positive Faber's test, but otherwise negative straight leg raise, psoas stressing, tenderness otherwise.  Examination of the right knee with tenderness at the medial joint line, dynamic patellar maltracking with passive flexion/extension, provocative testing negative other than after mentioned positive Ober's test.  Patient's clinical  history and findings are most consistent with iliotibial band syndrome, considered secondary to known lumbar spondylosis, described the underlying musculature at the deep hip in relation to the greater trochanter, additionally right knee arthralgia localizing to the patellofemoral and medial tibiofemoral compartments.  Plan for dedicated x-rays of the right knee and right hip, scheduled meloxicam x2 weeks, transition to as needed NSAID (patient will contact us in this regard) thereafter, and formal PT.  He will return for follow-up in 6 weeks for reevaluation, suboptimal response can be addressed with corticosteroid injections, advanced imaging consideration.      Relevant Orders   DG Knee Complete 4 Views Right   DG Hip Unilat W OR W/O Pelvis 2-3 Views Right   Ambulatory referral to Physical Therapy     Orders & Medications Meds ordered this encounter  Medications   meloxicam (MOBIC) 15 MG tablet    Sig: Take 1 tablet (15 mg total) by mouth daily.    Dispense:  14 tablet    Refill:  0   Orders Placed This Encounter  Procedures   DG Knee Complete 4 Views Right   DG Hip Unilat W OR W/O Pelvis 2-3 Views Right   Ambulatory referral to Physical Therapy     Return in about 6 weeks (around 03/15/2022).     [name], MD   Primary Care Sports Medicine [facility]

## 2022-02-01 NOTE — Assessment & Plan Note (Signed)
Roughly 1 year of atraumatic right lateral hip and right lateral knee pain, in the setting of lumbar spondylosis with myelopathy, has been treating this with ibuprofen 800 mg every other day with improvement.  Was advised by his PCP to discontinue long-term NSAID usage, recent renal function reviewed.  He denies any paresthesias distal to the knee, has noted intermittent clicks and pops from the knee, and pain is worse with squatting, bending.  Examination of the right hip with faint tenderness at the right greater trochanter, positive Ober's test, positive Faber's test, but otherwise negative straight leg raise, psoas stressing, tenderness otherwise.  Examination of the right knee with tenderness at the medial joint line, dynamic patellar maltracking with passive flexion/extension, provocative testing negative other than after mentioned positive Ober's test.  Patient's clinical history and findings are most consistent with iliotibial band syndrome, considered secondary to known lumbar spondylosis, described the underlying musculature at the deep hip in relation to the greater trochanter, additionally right knee arthralgia localizing to the patellofemoral and medial tibiofemoral compartments.  Plan for dedicated x-rays of the right knee and right hip, scheduled meloxicam x2 weeks, transition to as needed NSAID (patient will contact us in this regard) thereafter, and formal PT.  He will return for follow-up in 6 weeks for reevaluation, suboptimal response can be addressed with corticosteroid injections, advanced imaging consideration.

## 2022-02-01 NOTE — Progress Notes (Signed)
Name: Jorge Palmer   MRN: [id number]    DOB: May 30, 1970   Date:02/01/2022       Progress Note  Subjective  Chief Complaint  No chief complaint on file.   HPI  Patient presents for annual CPE .   IPSS Questionnaire (AUA-7): Over the past month.   1)  How often have you had a sensation of not emptying your bladder completely after you finish urinating?  2 - Less than half the time  2)  How often have you had to urinate again less than two hours after you finished urinating? 3 - About half the time  3)  How often have you found you stopped and started again several times when you urinated?  1 - Less than 1 time in 5  4) How difficult have you found it to postpone urination?  0 - Not at all  5) How often have you had a weak urinary stream?  1 - Less than 1 time in 5  6) How often have you had to push or strain to begin urination?  0 - Not at all  7) How many times did you most typically get up to urinate from the time you went to bed until the time you got up in the morning?  2 - 2 times  Total score:  0-7 mildly symptomatic   8-19 moderately symptomatic   20-35 severely symptomatic     Diet: balanced  Exercise: advised to increase physical activity  Last Dental Exam: up to date  Last Eye Exam: once  a year  Depression: phq 9 is negative    02/01/2022   11:09 AM 02/01/2022   11:05 AM 01/26/2021   11:17 AM 06/21/2017   12:05 PM  Depression screen PHQ 2/9  Decreased Interest 0 0 0 0  Down, Depressed, Hopeless 0 0 0 0  PHQ - 2 Score 0 0 0 0  Altered sleeping 0  0   Tired, decreased energy 0  0   Change in appetite 0  0   Feeling bad or failure about yourself  0  0   Trouble concentrating 0  0   Moving slowly or fidgety/restless 0  0   Suicidal thoughts 0  0   PHQ-9 Score 0  0     Hypertension:  BP Readings from Last 3 Encounters:  02/01/22 116/70  03/10/21 112/69  01/26/21 120/68    Obesity: Wt Readings from Last 3 Encounters:  02/01/22 176 lb 11.2 oz (80.2  kg)  03/10/21 160 lb (72.6 kg)  01/26/21 160 lb (72.6 kg)   BMI Readings from Last 3 Encounters:  02/01/22 24.64 kg/m  03/10/21 22.32 kg/m  01/26/21 22.32 kg/m     Lipids:  Lab Results  Component Value Date   CHOL 208 (H) 01/26/2021   CHOL 192 06/28/2017   Lab Results  Component Value Date   HDL 50 01/26/2021   HDL 44 06/28/2017   Lab Results  Component Value Date   LDLCALC 131 (H) 01/26/2021   LDLCALC 128 (H) 06/28/2017   Lab Results  Component Value Date   TRIG 158 (H) 01/26/2021   TRIG 97 06/28/2017   Lab Results  Component Value Date   CHOLHDL 4.2 01/26/2021   CHOLHDL 4.4 06/28/2017   No results found for: "LDLDIRECT" Glucose:  Glucose, Bld  Date Value Ref Range Status  01/26/2021 91 65 - 99 mg/dL Final    Comment:    .  Fasting reference interval .   09/02/2018 139 (H) 70 - 99 mg/dL Final  16/01/9603 92 65 - 99 mg/dL Final    Comment:    .            Fasting reference interval .     Flowsheet Row Office Visit from 02/01/2022 in [facility]  AUDIT-C Score 0       Married STD testing and prevention (HIV/chl/gon/syphilis):  not applicable Sexual history: one sexual partner , noticing difficulty maintaining an erection  Hep C Screening: up to date  Skin cancer: Discussed monitoring for atypical lesions Colorectal cancer: up to date  Prostate cancer:  positive symptoms of LUTS, no family history of prostate cancer    Lung cancer:  Low Dose CT Chest recommended if Age 15-80 years, 30 pack-year currently smoking OR have quit w/in 15years. Patient  not applicable a candidate for screening   AAA: The USPSTF recommends one-time screening with ultrasonography in men ages 65 to 75 years who have ever smoked. Patient   not applicable, a candidate for screening  ECG:  2015  Vaccines:    Tdap: up to date  Shingrix: start at age 64  Pneumonia: up to date Flu: today  COVID-19: discussed booster   Advanced Care  Planning: A voluntary discussion about advance care planning including the explanation and discussion of advance directives.  Discussed health care proxy and Living will, and the patient was able to identify a health care proxy as wife .  Patient has a living will and power of attorney of health care   Patient Active Problem List   Diagnosis Date Noted   Migraine headache with aura 01/26/2021   Perennial allergic rhinitis with seasonal variation 06/21/2017   Irritable bowel syndrome (IBS) 06/21/2017   Lumbar spondylosis with myelopathy 01/16/2015   Pulmonary sarcoidosis (HCC) 12/19/2013    Past Surgical History:  Procedure Laterality Date   COLONOSCOPY WITH PROPOFOL N/A 03/10/2021   Procedure: COLONOSCOPY WITH PROPOFOL;  Surgeon: Wyline Mood, MD;  Location: [facility] ENDOSCOPY;  Service: Gastroenterology;  Laterality: N/A;   microlaminectomy L4-5  01/2016    Family History  Problem Relation Age of Onset   Cancer Maternal Grandfather        lung   Heart disease Maternal Grandfather    Cancer Maternal Grandmother        ovarian   Rheum arthritis Maternal Grandmother    Cancer Father        melanoma   Migraines Sister    Parkinson's disease Paternal Grandmother    Liver disease Paternal Grandfather     Social History   Socioeconomic History   Marital status: Married    Spouse name: Selena Batten    Number of children: 1   Years of education: Not on file   Highest education level: Bachelor's degree (e.g., BA, AB, BS)  Occupational History   Occupation: area vice president     Comment: [employer] business.   Tobacco Use   Smoking status: Former    Packs/day: 0.50    Years: 6.00    Total pack years: 3.00    Types: Cigarettes    Start date: 04/05/1997    Quit date: 12/20/2003    Years since quitting: 18.1   Smokeless tobacco: Never  Vaping Use   Vaping Use: Never used  Substance and Sexual Activity   Alcohol use: No    Comment: since 2017 never a heavy drinker   Drug use: No  Sexual activity: Yes    Partners: Female  Other Topics Concern   Not on file  Social History Narrative   Married, one child.    Social Determinants of Health   Financial Resource Strain: Low Risk  (02/01/2022)   Overall Financial Resource Strain (CARDIA)    Difficulty of Paying Living Expenses: Not hard at all  Food Insecurity: No Food Insecurity (02/01/2022)   Hunger Vital Sign    Worried About Running Out of Food in the Last Year: Never true    Ran Out of Food in the Last Year: Never true  Transportation Needs: No Transportation Needs (02/01/2022)   PRAPARE - Administrator, Civil Service (Medical): No    Lack of Transportation (Non-Medical): No  Physical Activity: Insufficiently Active (02/01/2022)   Exercise Vital Sign    Days of Exercise per Week: 1 day    Minutes of Exercise per Session: 20 min  Stress: Stress Concern Present (02/01/2022)   [employer] of Occupational Health - Occupational Stress Questionnaire    Feeling of Stress : To some extent  Social Connections: Moderately Integrated (02/01/2022)   Social Connection and Isolation Panel [NHANES]    Frequency of Communication with Friends and Family: Once a week    Frequency of Social Gatherings with Friends and Family: Once a week    Attends Religious Services: More than 4 times per year    Active Member of [employer] or Organizations: Yes    Attends Banker Meetings: 1 to 4 times per year    Marital Status: Married  Catering manager Violence: Not At Risk (02/01/2022)   Humiliation, Afraid, Rape, and Kick questionnaire    Fear of Current or Ex-Partner: No    Emotionally Abused: No    Physically Abused: No    Sexually Abused: No     Current Outpatient Medications:    aspirin-acetaminophen-caffeine (EXCEDRIN MIGRAINE) 250-250-65 MG tablet, Take 1 tablet by mouth every 6 (six) hours as needed., Disp: , Rfl:    ibuprofen (ADVIL) 200 MG tablet, Take by mouth., Disp: , Rfl:   Allergies   Allergen Reactions   Sulfa Antibiotics     Rash-as an infant     ROS  Constitutional: Negative for fever or weight change.  Respiratory: Negative for cough and shortness of breath.   Cardiovascular: Negative for chest pain or palpitations.  Gastrointestinal: Negative for abdominal pain, no bowel changes.  Musculoskeletal: positive  for gait problem intermittent do to right outer hip pain and knee pain, difficulty squatting, he will contact sports medicine  Skin: Negative for rash.  Neurological: Negative for dizziness or headache.  No other specific complaints in a complete review of systems (except as listed in HPI above).    Objective  Vitals:   02/01/22 1103  BP: 116/70  Pulse: 60  Resp: 14  Temp: 98.1 F (36.7 C)  TempSrc: Oral  SpO2: 99%  Weight: 176 lb 11.2 oz (80.2 kg)  Height: 5\' 11"  (1.803 m)    Body mass index is 24.64 kg/m.  Physical Exam  Constitutional: Patient appears well-developed and well-nourished. No distress.  HENT: Head: Normocephalic and atraumatic. Ears: B TMs ok, no erythema or effusion; Nose: Nose normal. Mouth/Throat: Oropharynx is clear and moist. No oropharyngeal exudate.  Eyes: Conjunctivae and EOM are normal. Pupils are equal, round, and reactive to light. No scleral icterus.  Neck: Normal range of motion. Neck supple. No JVD present. No thyromegaly present.  Cardiovascular: Normal rate, regular rhythm and normal  heart sounds.  No murmur heard. No BLE edema. Pulmonary/Chest: Effort normal and breath sounds normal. No respiratory distress. Abdominal: Soft. Bowel sounds are normal, no distension. There is no tenderness. no masses MALE GENITALIA: Normal descended testes bilaterally, no masses palpated, no hernias, no lesions, no discharge RECTAL: Prostate normal size and consistency, no rectal masses or hemorrhoids Musculoskeletal: Normal range of motion, no joint effusions, normal pain during palpation of right trochanteric bursa, normal  rom of hip and knee . No gross deformities  Neurological: he is alert and oriented to person, place, and time. No cranial nerve deficit. Coordination, balance, strength, speech and gait are normal.  Skin: Skin is warm and dry. No rash noted. No erythema.  Psychiatric: Patient has a normal mood and affect. behavior is normal. Judgment and thought content normal.   Fall Risk:    02/01/2022   11:08 AM 01/26/2021   11:17 AM 06/21/2017   12:05 PM  Fall Risk   Falls in the past year? 0 0 No  Number falls in past yr:  0   Injury with Fall?  0   Risk for fall due to : No Fall Risks No Fall Risks   Follow up Falls prevention discussed;Education provided;Falls evaluation completed Falls prevention discussed      Functional Status Survey: Is the patient deaf or have difficulty hearing?: No Does the patient have difficulty seeing, even when wearing glasses/contacts?: No Does the patient have difficulty concentrating, remembering, or making decisions?: No Does the patient have difficulty walking or climbing stairs?: No Does the patient have difficulty dressing or bathing?: No Does the patient have difficulty doing errands alone such as visiting a doctor's office or shopping?: No    Assessment & Plan  1. Well adult exam  - PSA - Lipid panel - CBC with Differential/Platelet - COMPLETE METABOLIC PANEL WITH GFR - Vitamin B12  2. Need for immunization against influenza  - Flu Vaccine QUAD 6+ mos PF IM (Fluarix Quad PF)  3. Lower urinary tract symptoms (LUTS)  - PSA  4. Dyslipidemia  - Lipid panel  5. Low serum vitamin B12  - CBC with Differential/Platelet - Vitamin B12  6. Hair loss   7. Pulmonary sarcoidosis (HCC)  Follow up with Dr. Marcos Eke    -Prostate cancer screening and PSA options (with potential risks and benefits of testing vs not testing) were discussed along with recent recs/guidelines. -USPSTF grade A and B recommendations reviewed with patient;  age-appropriate recommendations, preventive care, screening tests, etc discussed and encouraged; healthy living encouraged; see AVS for patient education given to patient -Discussed importance of 150 minutes of physical activity weekly, eat two servings of fish weekly, eat one serving of tree nuts ( cashews, pistachios, pecans, almonds.Marland Kitchen) every other day, eat 6 servings of fruit/vegetables daily and drink plenty of water and avoid sweet beverages.  -Reviewed Health Maintenance: yes

## 2022-02-02 LAB — CBC WITH DIFFERENTIAL/PLATELET
Absolute Monocytes: 377 cells/uL (ref 200–950)
Basophils Absolute: 59 cells/uL (ref 0–200)
Basophils Relative: 0.9 %
Eosinophils Absolute: 299 cells/uL (ref 15–500)
Eosinophils Relative: 4.6 %
HCT: 46.1 % (ref 38.5–50.0)
Hemoglobin: 15.6 g/dL (ref 13.2–17.1)
Lymphs Abs: 1320 cells/uL (ref 850–3900)
MCH: 31.2 pg (ref 27.0–33.0)
MCHC: 33.8 g/dL (ref 32.0–36.0)
MCV: 92.2 fL (ref 80.0–100.0)
MPV: 11.1 fL (ref 7.5–12.5)
Monocytes Relative: 5.8 %
Neutro Abs: 4446 cells/uL (ref 1500–7800)
Neutrophils Relative %: 68.4 %
Platelets: 227 10*3/uL (ref 140–400)
RBC: 5 10*6/uL (ref 4.20–5.80)
RDW: 12.1 % (ref 11.0–15.0)
Total Lymphocyte: 20.3 %
WBC: 6.5 10*3/uL (ref 3.8–10.8)

## 2022-02-02 LAB — COMPLETE METABOLIC PANEL WITH GFR
AG Ratio: 1.8 (calc) (ref 1.0–2.5)
ALT: 20 U/L (ref 9–46)
AST: 20 U/L (ref 10–40)
Albumin: 4.4 g/dL (ref 3.6–5.1)
Alkaline phosphatase (APISO): 48 U/L (ref 36–130)
BUN: 22 mg/dL (ref 7–25)
CO2: 29 mmol/L (ref 20–32)
Calcium: 9.3 mg/dL (ref 8.6–10.3)
Chloride: 103 mmol/L (ref 98–110)
Creat: 1.02 mg/dL (ref 0.60–1.29)
Globulin: 2.4 g/dL (calc) (ref 1.9–3.7)
Glucose, Bld: 83 mg/dL (ref 65–99)
Potassium: 4.3 mmol/L (ref 3.5–5.3)
Sodium: 142 mmol/L (ref 135–146)
Total Bilirubin: 0.8 mg/dL (ref 0.2–1.2)
Total Protein: 6.8 g/dL (ref 6.1–8.1)
eGFR: 90 mL/min/{1.73_m2} (ref >=60)

## 2022-02-02 LAB — LIPID PANEL
Cholesterol: 211 mg/dL — ABNORMAL HIGH (ref <200)
HDL: 48 mg/dL (ref >=40)
LDL Cholesterol (Calc): 119 mg/dL (calc) — ABNORMAL HIGH
Non-HDL Cholesterol (Calc): 163 mg/dL (calc) — ABNORMAL HIGH (ref <130)
Total CHOL/HDL Ratio: 4.4 (calc) (ref <5.0)
Triglycerides: 283 mg/dL — ABNORMAL HIGH (ref <150)

## 2022-02-02 LAB — VITAMIN B12: Vitamin B-12: 552 pg/mL (ref 200–1100)

## 2022-02-02 LAB — PSA: PSA: 0.18 ng/mL (ref <=4.00)

## 2022-02-04 ENCOUNTER — Encounter: Payer: Self-pay | Admitting: Family Medicine

## 2022-02-04 NOTE — Telephone Encounter (Signed)
FYI

## 2022-02-10 NOTE — Telephone Encounter (Signed)
Please review.  KP

## 2022-02-11 ENCOUNTER — Other Ambulatory Visit: Payer: Self-pay | Admitting: Family Medicine

## 2022-02-11 MED ORDER — MELOXICAM 15 MG PO TABS: 15.0000 mg | ORAL_TABLET | Freq: Every day | ORAL | 0 refills | Status: DC

## 2022-03-09 ENCOUNTER — Encounter: Payer: Self-pay | Admitting: Physical Therapy

## 2022-03-09 ENCOUNTER — Ambulatory Visit: Payer: BC Managed Care – PPO | Attending: Family Medicine | Admitting: Physical Therapy

## 2022-03-09 DIAGNOSIS — M25551 Pain in right hip: Secondary | ICD-10-CM | POA: Insufficient documentation

## 2022-03-09 DIAGNOSIS — G8929 Other chronic pain: Secondary | ICD-10-CM | POA: Diagnosis not present

## 2022-03-09 DIAGNOSIS — M25561 Pain in right knee: Secondary | ICD-10-CM | POA: Diagnosis not present

## 2022-03-09 DIAGNOSIS — M7631 Iliotibial band syndrome, right leg: Secondary | ICD-10-CM | POA: Diagnosis not present

## 2022-03-09 NOTE — Therapy (Signed)
OUTPATIENT PHYSICAL THERAPY LOWER EXTREMITY EVALUATION   Patient Name: Jorge Palmer MRN: [id number] DOB:Nov 02, 1970, 51 y.o., male Today's Date: 03/10/2022   PT End of Session - 03/09/22 1258     Visit Number 1    Date for PT Re-Evaluation 05/04/22    Authorization - Visit Number 1    Progress Note Due on Visit 10    PT Start Time 1300    PT Stop Time 1345    PT Time Calculation (min) 45 min    Activity Tolerance Patient tolerated treatment well    Behavior During Therapy [facility] for tasks assessed/performed             Past Medical History:  Diagnosis Date   Sarcoidosis    Past Surgical History:  Procedure Laterality Date   COLONOSCOPY WITH PROPOFOL N/A 03/10/2021   Procedure: COLONOSCOPY WITH PROPOFOL;  Surgeon: Wyline Mood, MD;  Location: [facility] ENDOSCOPY;  Service: Gastroenterology;  Laterality: N/A;   microlaminectomy L4-5  01/2016   Patient Active Problem List   Diagnosis Date Noted   It band syndrome, right 02/01/2022   Migraine headache with aura 01/26/2021   Perennial allergic rhinitis with seasonal variation 06/21/2017   Irritable bowel syndrome (IBS) 06/21/2017   Lumbar spondylosis with myelopathy 01/16/2015   Pulmonary sarcoidosis (HCC) 12/19/2013    PCP: Alba Cory, MD   REFERRING PROVIDER: [name], MD   REFERRING DIAG: M76.31 (ICD-10-CM) - It band syndrome, right   THERAPY DIAG:  Pain in right hip  Chronic pain of right knee  Rationale for Evaluation and Treatment Rehabilitation  ONSET DATE: 6 months  SUBJECTIVE:   SUBJECTIVE STATEMENT: Pain at night. 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 Pt reports insidious onset of hip and knee pain over 6 months ago and reports pain is aggravated with squatting, bending, stair  ambulation and is worse at night. Knee pain is reported with knee bending movements but hip pain is worse at night. Pt reports pain keeps him up about an hour every night but is eventually is able to get to sleep. 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 Pt reports worst pain is hip at night NPRS scale: 5/10.   PAIN:  Are you having pain? Yes: NPRS scale: 5/10 Pain location: Outside of knee, hip   Pain description: dull Aggravating factors: at night in hip , bending in the knee  Relieving factors: Reposition, Meloxicam helping but not completely relive pain.  PRECAUTIONS: None  WEIGHT BEARING RESTRICTIONS: No  FALLS:  Has patient fallen in last 6 months? No  LIVING ENVIRONMENT:  Lives in: House/apartment Stairs:  Internal: Yes , 1 flight to bedroom External: Yes Has following equipment at home: None Feels it in the knee and the hips  Outside and outside    Back spurs cleaned up 5-6 years ago OCCUPATION: Work at home sitting down 80 % of the time  PLOF: Independent  PATIENT GOALS: sleep better/less disturbed sleep, decrease knee tightness/pain and lateral R hip pain with bending movements   OBJECTIVE:   DIAGNOSTIC FINDINGS: See Orders, referred for imaging  PATIENT SURVEYS:  FOTO 59/76  COGNITION: Overall cognitive status: Within functional limits for tasks assessed     SENSATION: WFL  Tingling sensation reported during prone knee  bend test lateral lower leg into toes  EDEMA:  None  MUSCLE LENGTH: Hamstrings: Right TBA deg; Left TBA deg Maisie Fus test: Right TBA deg; Left TBA deg  POSTURE: rounded shoulders  PALPATION: Tenderness noted medial joint line of knee, pain is described in this location during single leg squats and stair ambulation. No tenderness noted at lateral hip/gluteus maximus, however tenderness noted at gluteus medius and gluteus maximus origins from iliac.  LOWER EXTREMITY ROM:  Active ROM with OP  Right eval Left eval  Hip flexion [facility]  Hip extension [facility]  Hip abduction [facility] WFL  Hip adduction [facility] WFL  Hip internal rotation [facility] WFL  Hip external rotation [facility] WFL  Knee flexion WNL WNL  Knee extension WNL WNL  Ankle dorsiflexion    Ankle plantarflexion    Ankle inversion    Ankle eversion     (Blank rows = not tested)  LOWER EXTREMITY MMT:  MMT Right eval Left eval  Hip flexion 5 4  Hip extension 4+ 5  Hip abduction 4 pain 5  Hip adduction 5 4  Hip internal rotation 5 5  Hip external rotation 5 5  Knee flexion TBA TBA  Knee extension TBA TBA  Ankle dorsiflexion    Ankle plantarflexion    Ankle inversion    Ankle eversion     (Blank rows = not tested)  LOWER EXTREMITY SPECIAL TESTS:  Ober's Test: positive on R Thessaly test: negative on R Patellar grind test: postive on R  FUNCTIONAL TESTS:  5 times sit to stand: 9.70 sec 10 meter walk test: Self Selected: 8.20 seconds(1.22 m/s); fastest: 5.65 seconds (1.77 m/s) Single Leg Leg Press: R 115 pounds; L 105 pounds Stair ambulation: reciprocal gait pattern with no rail  GAIT: Distance walked: 75 feet Assistive device utilized: None Level of assistance: Complete Independence Comments: Pt demonstrates toe out bilaterally, worse on LLE   TODAY'S TREATMENT                                                                            Today's treatment consisted of an examination and evaluation of lumbar spine and bilateral LE.  1 Rep maximum on Leg press: R:115 L:105 Sit to stands 3 sets of 5 reps; pt cued for avoidance of UE usage Single leg Squats with opposite leg on elevated mat table, 1 set of 8 reps bilaterally Hip abduction plank from feet, 8 reps, 2 sets; pt cued for abdomen and leg alignment to target lateral hip musculature Gluteus medius stretch on elevated mat table, R side only, 30 second holds, 2 reps   PATIENT EDUCATION:  Education details: Patient was educated on diagnosis, anatomy and  pathology involved, prognosis, role of PT, and was given an HEP, demonstrating exercise with proper form following verbal and tactile cues, and was given a paper hand out to continue exercise at home. Pt was educated on and agreed to plan of care.  Person educated: Patient Education method: Explanation, Demonstration, Tactile cues, Verbal cues, and Handouts Education comprehension: verbalized understanding, returned demonstration, verbal cues required, and tactile cues required  HOME EXERCISE PROGRAM: Single leg squats, 8-12 reps, 2-3 sets, 3 times per week Hip abduction side planks, 8-12  reps, 1-2 second holds, 2-3 sets, 3 times per week  Side lying gluteus medius stretch, 30 second holds, 2-3 times per day, 7 days per week.  ASSESSMENT:  CLINICAL IMPRESSION: Patient is a 51 y.o. male who was seen today for physical therapy evaluation and treatment for right hip and right knee pain. Pt demonstrates toeing out during gait cycle left worse than right although normal gait speed with no reported pain. Objective impairments also include decreased activity tolerance, increased muscle tension, decreased hip ext and abd strength, and pain. Activity limitations include bending, sitting, squatting, sleeping, stair ambulation and single leg squat exercise. Participation limitations include driving, community activities, and advanced chores around the house. Would benefit from skilled PT to address above deficits and promote optimal return to PLOF.   OBJECTIVE IMPAIRMENTS: Abnormal gait, decreased activity tolerance, decreased mobility, impaired flexibility, and pain.   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   REHAB POTENTIAL: Good  CLINICAL DECISION MAKING: Evolving/moderate complexity  EVALUATION COMPLEXITY: Moderate   GOALS: Goals reviewed with patient? No  SHORT TERM GOALS: Target date: 04/06/2022 Pt will be independent with HEP in order to improve strength and symmetry of bilateral LE musculature in order to address deficits with functional movements decrease pain level in RLE. Baseline: 03/09/2022: HEP given Goal status: INITIAL   LONG TERM GOALS: Target date: 05/04/2022  Patient will increase FOTO score to 76 to demonstrate predicted increase in functional mobility to complete ADLs pain free.  Baseline: 59 Goal status: INITIAL  2.  Pt will demonstrate ability to ambulate one flight of 4 stairs without R knee pain for increased community ambulation. Baseline: unable to negotiate 4 stairs without pain Goal status: INITIAL  3.  Pt will report 5/7 nights without R hip pain keeping him awake for improved sleep and quality of life. Baseline: pain disturbs sleep every night Goal status: INITIAL     PLAN:  PT FREQUENCY: 1-2x/week  PT DURATION: 8 weeks  PLANNED INTERVENTIONS: Therapeutic exercises, Therapeutic activity, Neuromuscular re-education, Balance training, Gait training, Patient/Family education, Self Care, Joint mobilization, and Manual therapy  PLAN FOR NEXT SESSION: educated on loose packed position for improved sleeping  Hilda Lias DPT Hilda Lias, PT 03/10/2022, 8:04 AM

## 2022-03-10 ENCOUNTER — Encounter: Payer: Self-pay | Admitting: Physical Therapy

## 2022-03-10 NOTE — Addendum Note (Signed)
Addended by: Kelton Pillar on: 03/10/2022 08:07 AM   Modules accepted: Orders

## 2022-03-13 ENCOUNTER — Other Ambulatory Visit: Payer: Self-pay | Admitting: Family Medicine

## 2022-03-15 ENCOUNTER — Ambulatory Visit: Payer: BC Managed Care – PPO | Admitting: Family Medicine

## 2022-03-15 ENCOUNTER — Ambulatory Visit: Payer: BC Managed Care – PPO | Admitting: Physical Therapy

## 2022-03-15 ENCOUNTER — Encounter: Payer: Self-pay | Admitting: Family Medicine

## 2022-03-15 VITALS — BP 118/78 | HR 68 | Ht 71.0 in | Wt 185.4 lb

## 2022-03-15 DIAGNOSIS — M1711 Unilateral primary osteoarthritis, right knee: Secondary | ICD-10-CM | POA: Diagnosis not present

## 2022-03-15 DIAGNOSIS — M7631 Iliotibial band syndrome, right leg: Secondary | ICD-10-CM | POA: Diagnosis not present

## 2022-03-15 MED ORDER — MELOXICAM 15 MG PO TABS: 15.0000 mg | ORAL_TABLET | Freq: Every day | ORAL | 0 refills | Status: DC | PRN

## 2022-03-15 NOTE — Patient Instructions (Signed)
-   Transition to meloxicam daily on an as-needed basis for hip/knee symptoms - Continue with physical therapy, discuss dry needling with therapists - Can utilize foam roller for IT band, bring to PT for additional home exercises - Return for follow-up in 6 weeks, contact for any questions/concerns between now and then

## 2022-03-15 NOTE — Telephone Encounter (Signed)
Last RF 02/11/22 #45   Requested Prescriptions  Refused Prescriptions Disp Refills   meloxicam (MOBIC) 15 MG tablet [Pharmacy Med Name: MELOXICAM 15 MG TABLET] 30 tablet 1    Sig: TAKE 1 TABLET (15 MG TOTAL) BY MOUTH DAILY.     Analgesics:  COX2 Inhibitors Failed - 03/13/2022  9:45 AM      Failed - Manual Review: Labs are only required if the patient has taken medication for more than 8 weeks.      Passed - HGB in normal range and within 360 days    Hemoglobin  Date Value Ref Range Status  02/01/2022 15.6 13.2 - 17.1 g/dL Final         Passed - Cr in normal range and within 360 days    Creat  Date Value Ref Range Status  02/01/2022 1.02 0.60 - 1.29 mg/dL Final         Passed - HCT in normal range and within 360 days    HCT  Date Value Ref Range Status  02/01/2022 46.1 38.5 - 50.0 % Final         Passed - AST in normal range and within 360 days    AST  Date Value Ref Range Status  02/01/2022 20 10 - 40 U/L Final         Passed - ALT in normal range and within 360 days    ALT  Date Value Ref Range Status  02/01/2022 20 9 - 46 U/L Final         Passed - eGFR is 30 or above and within 360 days    GFR, Est African American  Date Value Ref Range Status  06/28/2017 96 > OR = 60 mL/min/1.58m Final   GFR calc Af Amer  Date Value Ref Range Status  09/02/2018 >60 >60 mL/min Final   GFR, Est Non African American  Date Value Ref Range Status  06/28/2017 82 > OR = 60 mL/min/1.756mFinal   GFR calc non Af Amer  Date Value Ref Range Status  09/02/2018 >60 >60 mL/min Final   eGFR  Date Value Ref Range Status  02/01/2022 90 > OR = 60 mL/min/1.7372minal         Passed - Patient is not pregnant      Passed - Valid encounter within last 12 months    Recent Outpatient Visits           1 month ago It band syndrome, right   [facility]   1 month ago [facility]   1 year ago Well adult [facility]   4 years [facility]       Future Appointments             Today [facility]

## 2022-03-15 NOTE — Assessment & Plan Note (Signed)
Patient returns for follow-up to chronic right lateral hip discomfort, noted mostly at night.  Over interim he has started meloxicam scheduled, reports roughly 70% improvement.  He attended 1 session of physical therapy with more upcoming.  Examination with focal mild tenderness just posterior to the greater trochanter, positive Ober's, interval improved.  Given patient's reassuring x-rays, reported progress, and objective findings today, I have advised patient to transition to as needed meloxicam, continue physical therapy, consideration of dry needling, foam rolling, and follow-up in 6 weeks with low threshold to advance to corticosteroid injections as indicated.

## 2022-03-15 NOTE — Progress Notes (Signed)
Primary Care / Sports Medicine Office Visit  Patient Information:  Patient ID: Jorge Palmer, male DOB: 1971-01-22 Age: 51 y.o. MRN: [id number]   Jorge Palmer is a pleasant 51 y.o. male presenting with the following:  Chief Complaint  Patient presents with   It band syndrome, right    Meloxicam has helped. Had 1 PT appointment    Vitals:   03/15/22 1536  BP: 118/78  Pulse: 68  SpO2: 98%   Vitals:   03/15/22 1536  Weight: 185 lb 6.4 oz (84.1 kg)  Height: 5\' 11"  (1.803 m)   Body mass index is 25.86 kg/m.  No results found.   Independent interpretation of notes and tests performed by another provider:   Independent interpretation of right hip and knee x-rays dated 02/01/2022 reveal mild-moderate right hip osteoarthritis, subtle cortical spurring at the greater trochanter, no acute osseous processes.  Right knee x-rays demonstrate mild medial tibiofemoral narrowing, evidence of subtle dynamic maltracking, no acute osseous processes identified.  Procedures performed:   None  Pertinent History, Exam, Impression, and Recommendations:   Problem List Items Addressed This Visit       Musculoskeletal and Integument   It band syndrome, right - Primary    Patient returns for follow-up to chronic right lateral hip discomfort, noted mostly at night.  Over interim he has started meloxicam scheduled, reports roughly 70% improvement.  He attended 1 session of physical therapy with more upcoming.  Examination with focal mild tenderness just posterior to the greater trochanter, positive Ober's, interval improved.  Given patient's reassuring x-rays, reported progress, and objective findings today, I have advised patient to transition to as needed meloxicam, continue physical therapy, consideration of dry needling, foam rolling, and follow-up in 6 weeks with low threshold to advance to corticosteroid injections as indicated.      Primary osteoarthritis of right knee     Patient was return for follow-up to right knee instability, chronic, in the setting of ipsilateral IT band syndrome.  X-rays were obtained over the interim, reports roughly 70% improvement after meloxicam scheduled and start of physical therapy.  Examination with full painless range of motion, very mild tenderness at the medial joint line, positive Ober.  Given patient's interval improvement, radiographic features, did discuss continued physical therapy with heavy focus on knee stabilization, gait mechanics, and he can transition to meloxicam on an as-needed basis.  Will coordinate a follow-up in 6 weeks with low threshold to advance to intra-articular corticosteroid injection as clinically guided.      Relevant Medications   meloxicam (MOBIC) 15 MG tablet     Orders & Medications Meds ordered this encounter  Medications   meloxicam (MOBIC) 15 MG tablet    Sig: Take 1 tablet (15 mg total) by mouth daily as needed for pain.    Dispense:  45 tablet    Refill:  0   No orders of the defined types were placed in this encounter.    Return in about 6 weeks (around 04/26/2022).     [name], MD, [facility]   Primary Care Sports Medicine Primary Care and Sports Medicine at [facility]

## 2022-03-15 NOTE — Assessment & Plan Note (Addendum)
Patient was return for follow-up to right knee instability, chronic, in the setting of ipsilateral IT band syndrome.  X-rays were obtained over the interim, reports roughly 70% improvement after meloxicam scheduled and start of physical therapy.  Examination with full painless range of motion, very mild tenderness at the medial joint line, positive Ober.  Given patient's interval improvement, radiographic features, did discuss continued physical therapy with heavy focus on knee stabilization, gait mechanics, and he can transition to meloxicam on an as-needed basis.  Will coordinate a follow-up in 6 weeks with low threshold to advance to intra-articular corticosteroid injection as clinically guided.

## 2022-03-17 ENCOUNTER — Ambulatory Visit: Payer: BC Managed Care – PPO | Admitting: Physical Therapy

## 2022-03-17 ENCOUNTER — Encounter: Payer: Self-pay | Admitting: Physical Therapy

## 2022-03-17 DIAGNOSIS — G8929 Other chronic pain: Secondary | ICD-10-CM

## 2022-03-17 DIAGNOSIS — M25561 Pain in right knee: Secondary | ICD-10-CM | POA: Diagnosis not present

## 2022-03-17 DIAGNOSIS — M25551 Pain in right hip: Secondary | ICD-10-CM

## 2022-03-17 DIAGNOSIS — M7631 Iliotibial band syndrome, right leg: Secondary | ICD-10-CM | POA: Diagnosis not present

## 2022-03-17 NOTE — Therapy (Signed)
OUTPATIENT PHYSICAL THERAPY LOWER EXTREMITY EVALUATION   Patient Name: Jorge Palmer MRN: [id number] DOB:03/12/1971, 51 y.o., male Today's Date: 03/17/2022   PT End of Session - 03/17/22 1132     Visit Number 2    Number of Visits 17    Date for PT Re-Evaluation 05/04/22    Authorization - Visit Number 2    Progress Note Due on Visit 10    PT Start Time 1132    PT Stop Time 1210    PT Time Calculation (min) 38 min    Behavior During Therapy [facility] for tasks assessed/performed              Past Medical History:  Diagnosis Date   Sarcoidosis    Past Surgical History:  Procedure Laterality Date   COLONOSCOPY WITH PROPOFOL N/A 03/10/2021   Procedure: COLONOSCOPY WITH PROPOFOL;  Surgeon: Wyline Mood, MD;  Location: [facility]COPY;  Service: Gastroenterology;  Laterality: N/A;   microlaminectomy L4-5  01/2016   Patient Active Problem List   Diagnosis Date Noted   Primary osteoarthritis of right knee 03/15/2022   It band syndrome, right 02/01/2022   Migraine headache with aura 01/26/2021   Perennial allergic rhinitis with seasonal variation 06/21/2017   Irritable bowel syndrome (IBS) 06/21/2017   Lumbar spondylosis with myelopathy 01/16/2015   Pulmonary sarcoidosis (HCC) 12/19/2013    PCP: Alba Cory, MD   REFERRING PROVIDER: [name], MD   REFERRING DIAG: M76.31 (ICD-10-CM) - It band syndrome, right   THERAPY DIAG:  Pain in right hip  Chronic pain of right knee  Rationale for Evaluation and Treatment Rehabilitation  ONSET DATE: 6 months  SUBJECTIVE:   SUBJECTIVE STATEMENT: Pt reports 4/10 pain this am. Reports his pain in the hip is worse at night, feels like it is more of a discomfort/achy pain. Reports compliance with HEP, with decent stretch and difficulty with strengthening therex.   PERTINENT HISTORY: Patient is a 51 year old male presenting with RLE pain in lateral hip region, medial knee pain, and knee tightness with flexed  functional movements. Pt reports insidious onset of hip and knee pain over 6 months ago and reports pain is aggravated with squatting, bending, stair ambulation and is worse at night. Knee pain is reported with knee bending movements but hip pain is worse at night. Pt reports pain keeps him up about an hour every night but is eventually is able to get to sleep. 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 Pt reports worst pain is hip at night NPRS scale: 5/10.   PAIN:  Are you having pain? Yes: NPRS scale: 5/10 Pain location: Outside of knee, hip   Pain description: dull Aggravating factors: at night in hip , bending in the knee  Relieving factors: Reposition, Meloxicam helping but not completely relive pain.  PRECAUTIONS: None  WEIGHT BEARING RESTRICTIONS: No  FALLS:  Has patient fallen in last 6 months? No  LIVING ENVIRONMENT:  Lives in: House/apartment Stairs:  Internal: Yes , 1 flight to bedroom External: Yes Has following equipment at home: None Feels it in the knee and the hips  Outside and outside    Back spurs cleaned up 5-6 years ago OCCUPATION: Work at home sitting down 80 % of the time  PLOF: Independent  PATIENT GOALS: sleep better/less disturbed sleep, decrease knee tightness/pain and lateral R hip pain with bending movements   OBJECTIVE:   DIAGNOSTIC FINDINGS: See Orders, referred for imaging  PATIENT SURVEYS:  FOTO 59/76  COGNITION: Overall cognitive status: Within functional limits for tasks assessed     SENSATION: WFL  Tingling sensation reported during prone knee bend test lateral lower leg into toes  EDEMA:  None  MUSCLE LENGTH: Hamstrings: Right TBA deg; Left TBA deg Maisie Fus test: Right TBA deg; Left TBA deg  POSTURE: rounded shoulders  PALPATION: Tenderness noted medial joint line of knee, pain is described in this location during single leg squats and stair ambulation. No tenderness noted at lateral  hip/gluteus maximus, however tenderness noted at gluteus medius and gluteus maximus origins from iliac.  LOWER EXTREMITY ROM:  Active ROM with OP Right eval Left eval  Hip flexion [facility]  Hip extension [facility]  Hip abduction [facility] WFL  Hip adduction [facility] WFL  Hip internal rotation [facility] WFL  Hip external rotation [facility] WFL  Knee flexion WNL WNL  Knee extension WNL WNL  Ankle dorsiflexion    Ankle plantarflexion    Ankle inversion    Ankle eversion     (Blank rows = not tested)  LOWER EXTREMITY MMT:  MMT Right eval Left eval  Hip flexion 5 4  Hip extension 4+ 5  Hip abduction 4 pain 5  Hip adduction 5 4  Hip internal rotation 5 5  Hip external rotation 5 5  Knee flexion TBA TBA  Knee extension TBA TBA  Ankle dorsiflexion    Ankle plantarflexion    Ankle inversion    Ankle eversion     (Blank rows = not tested)  LOWER EXTREMITY SPECIAL TESTS:  Ober's Test: positive on R Thessaly test: negative on R Patellar grind test: postive on R  FUNCTIONAL TESTS:  5 times sit to stand: 9.70 sec 10 meter walk test: Self Selected: 8.20 seconds(1.22 m/s); fastest: 5.65 seconds (1.77 m/s) Single Leg Leg Press: R 115 pounds; L 105 pounds Stair ambulation: reciprocal gait pattern with no rail  GAIT: Distance walked: 75 feet Assistive device utilized: None Level of assistance: Complete Independence Comments: Pt demonstrates toe out bilaterally, worse on LLE   TODAY'S TREATMENT                                                                            Nustep seat 11 UE 14 L2 for gentle hip motion and strengthening  Self roller technique to glute max/med ; to middle ITB x41min Squat review with good carry over of mechanics; with GTB for glute ER and abd activation 2x 10 with good carry over of cuing for glute activation  [place] split squat 2x 10 with good carry over of technique Single leg deadlift 2x 10 with 20# KB 2x 10 with good carry over of demo and initial cuing  for technique   PATIENT EDUCATION:  Education details: Patient was educated on diagnosis, anatomy and pathology involved, prognosis, role of PT, and was given an HEP, demonstrating exercise with proper form following verbal and tactile cues, and was given a paper hand out to continue exercise at home. Pt was educated on and agreed to plan of care.  Person educated: Patient Education method: Explanation, Demonstration, Tactile cues, Verbal cues, and Handouts Education comprehension: verbalized understanding, returned demonstration, verbal cues required,  and tactile cues required  HOME EXERCISE PROGRAM: Single leg squats, 8-12 reps, 2-3 sets, 3 times per week Hip abduction side planks, 8-12 reps, 1-2 second holds, 2-3 sets, 3 times per week  Side lying gluteus medius stretch, 30 second holds, 2-3 times per day, 7 days per week.  ASSESSMENT:  CLINICAL IMPRESSION: Patient is a 51 y.o. male who was seen today for physical therapy evaluation and treatment for right hip and right knee pain. Pt demonstrates toeing out during gait cycle left worse than right although normal gait speed with no reported pain. Objective impairments also include decreased activity tolerance, increased muscle tension, decreased hip ext and abd strength, and pain. Activity limitations include bending, sitting, squatting, sleeping, stair ambulation and single leg squat exercise. Participation limitations include driving, community activities, and advanced chores around the house. Would benefit from skilled PT to address above deficits and promote optimal return to PLOF.   OBJECTIVE IMPAIRMENTS: Abnormal gait, decreased activity tolerance, decreased mobility, impaired flexibility, and pain.   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   REHAB POTENTIAL: Good  CLINICAL DECISION MAKING: Evolving/moderate complexity  EVALUATION COMPLEXITY: Moderate   GOALS: Goals reviewed with patient? No  SHORT TERM GOALS: Target date: 04/06/2022 Pt will be independent with HEP in order to improve strength and symmetry of bilateral LE musculature in order to address deficits with functional movements decrease pain level in RLE. Baseline: 03/09/2022: HEP given Goal status: INITIAL   LONG TERM GOALS: Target date: 05/04/2022  Patient will increase FOTO score to 76 to demonstrate predicted increase in functional mobility to complete ADLs pain free.  Baseline: 59 Goal status: INITIAL  2.  Pt will demonstrate ability to ambulate one flight of 4 stairs without R knee pain for increased community ambulation. Baseline: unable to negotiate 4 stairs without pain Goal status: INITIAL  3.  Pt will report 5/7 nights without R hip pain keeping him awake for improved sleep and quality of life. Baseline: pain disturbs sleep every night Goal status: INITIAL     PLAN:  PT FREQUENCY: 1-2x/week  PT DURATION: 8 weeks  PLANNED INTERVENTIONS: Therapeutic exercises, Therapeutic activity, Neuromuscular re-education, Balance training, Gait training, Patient/Family education, Self Care, Joint mobilization, and Manual therapy  PLAN FOR NEXT SESSION: educated on loose packed position for improved sleeping  Hilda Lias DPT Hilda Lias, PT 03/17/2022, 12:49 PM

## 2022-03-23 ENCOUNTER — Encounter: Payer: Self-pay | Admitting: Physical Therapy

## 2022-03-23 ENCOUNTER — Ambulatory Visit: Payer: BC Managed Care – PPO | Admitting: Physical Therapy

## 2022-03-23 DIAGNOSIS — G8929 Other chronic pain: Secondary | ICD-10-CM

## 2022-03-23 DIAGNOSIS — M25551 Pain in right hip: Secondary | ICD-10-CM

## 2022-03-23 DIAGNOSIS — M25561 Pain in right knee: Secondary | ICD-10-CM | POA: Diagnosis not present

## 2022-03-23 DIAGNOSIS — M7631 Iliotibial band syndrome, right leg: Secondary | ICD-10-CM | POA: Diagnosis not present

## 2022-03-23 NOTE — Therapy (Signed)
OUTPATIENT PHYSICAL THERAPY LOWER EXTREMITY EVALUATION   Patient Name: Jorge Palmer MRN: [id number] DOB:[birth year], 51 y.o., male Today's Date: 03/23/2022   PT End of Session - 03/23/22 0834     Visit Number 3    Number of Visits 17    Date for PT Re-Evaluation 05/04/22    Authorization - Visit Number 3    Progress Note Due on Visit 10    PT Start Time 0832    PT Stop Time 0910    PT Time Calculation (min) 38 min    Activity Tolerance Patient tolerated treatment well    Behavior During Therapy [facility] for tasks assessed/performed              Past Medical History:  Diagnosis Date   Sarcoidosis    Past Surgical History:  Procedure Laterality Date   COLONOSCOPY WITH PROPOFOL N/A 03/10/2021   Procedure: COLONOSCOPY WITH PROPOFOL;  Surgeon: Wyline Mood, MD;  Location: [facility] ENDOSCOPY;  Service: Gastroenterology;  Laterality: N/A;   microlaminectomy L4-5  01/2016   Patient Active Problem List   Diagnosis Date Noted   Primary osteoarthritis of right knee 03/15/2022   It band syndrome, right 02/01/2022   Migraine headache with aura 01/26/2021   Perennial allergic rhinitis with seasonal variation 06/21/2017   Irritable bowel syndrome (IBS) 06/21/2017   Lumbar spondylosis with myelopathy 01/16/2015   Pulmonary sarcoidosis (HCC) 12/19/2013    PCP: Alba Cory, MD   REFERRING PROVIDER: [name], MD   REFERRING DIAG: M76.31 (ICD-10-CM) - It band syndrome, right   THERAPY DIAG:  Pain in right hip  Chronic pain of right knee  Rationale for Evaluation and Treatment Rehabilitation  ONSET DATE: 6 months  SUBJECTIVE:   SUBJECTIVE STATEMENT: Pt reports he had increased LBP following last session, that lasted for bout a day nd made him nervous to move. Hip and knee feeling okay, report 0/10 pain today. Pt reports no compliance with HEP d/t LBP.   PERTINENT HISTORY: Patient is a 51 year old male presenting with RLE pain in lateral hip region, medial knee  pain, and knee tightness with flexed functional movements. Pt reports insidious onset of hip and knee pain over 6 months ago and reports pain is aggravated with squatting, bending, stair ambulation and is worse at night. Knee pain is reported with knee bending movements but hip pain is worse at night. Pt reports pain keeps him up about an hour every night but is eventually is able to get to sleep. 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 Pt reports worst pain is hip at night NPRS scale: 5/10.   PAIN:  Are you having pain? Yes: NPRS scale: 5/10 Pain location: Outside of knee, hip   Pain description: dull Aggravating factors: at night in hip , bending in the knee  Relieving factors: Reposition, Meloxicam helping but not completely relive pain.  PRECAUTIONS: None  WEIGHT BEARING RESTRICTIONS: No  FALLS:  Has patient fallen in last 6 months? No  LIVING ENVIRONMENT:  Lives in: House/apartment Stairs:  Internal: Yes , 1 flight to bedroom External: Yes Has following equipment at home: None Feels it in the knee and the hips  Outside and outside    Back spurs cleaned up 5-6 years ago OCCUPATION: Work at home sitting down 80 % of the time  PLOF: Independent  PATIENT GOALS: sleep better/less disturbed sleep, decrease knee tightness/pain and lateral R hip pain with bending movements  OBJECTIVE:   DIAGNOSTIC FINDINGS: See Orders, referred for imaging  PATIENT SURVEYS:  FOTO 59/76  COGNITION: Overall cognitive status: Within functional limits for tasks assessed     SENSATION: WFL  Tingling sensation reported during prone knee bend test lateral lower leg into toes  EDEMA:  None  MUSCLE LENGTH: Hamstrings: Right TBA deg; Left TBA deg Maisie Fus test: Right TBA deg; Left TBA deg  POSTURE: rounded shoulders  PALPATION: Tenderness noted medial joint line of knee, pain is described in this location during single leg squats and stair  ambulation. No tenderness noted at lateral hip/gluteus maximus, however tenderness noted at gluteus medius and gluteus maximus origins from iliac.  LOWER EXTREMITY ROM:  Active ROM with OP Right eval Left eval  Hip flexion [facility]  Hip extension [facility]  Hip abduction [facility] WFL  Hip adduction [facility] WFL  Hip internal rotation [facility] WFL  Hip external rotation [facility] WFL  Knee flexion WNL WNL  Knee extension WNL WNL  Ankle dorsiflexion    Ankle plantarflexion    Ankle inversion    Ankle eversion     (Blank rows = not tested)  LOWER EXTREMITY MMT:  MMT Right eval Left eval  Hip flexion 5 4  Hip extension 4+ 5  Hip abduction 4 pain 5  Hip adduction 5 4  Hip internal rotation 5 5  Hip external rotation 5 5  Knee flexion TBA TBA  Knee extension TBA TBA  Ankle dorsiflexion    Ankle plantarflexion    Ankle inversion    Ankle eversion     (Blank rows = not tested)  LOWER EXTREMITY SPECIAL TESTS:  Ober's Test: positive on R Thessaly test: negative on R Patellar grind test: postive on R  FUNCTIONAL TESTS:  5 times sit to stand: 9.70 sec 10 meter walk test: Self Selected: 8.20 seconds(1.22 m/s); fastest: 5.65 seconds (1.77 m/s) Single Leg Leg Press: R 115 pounds; L 105 pounds Stair ambulation: reciprocal gait pattern with no rail  GAIT: Distance walked: 75 feet Assistive device utilized: None Level of assistance: Complete Independence Comments: Pt demonstrates toe out bilaterally, worse on LLE   TODAY'S TREATMENT                                                                            Ther-Ex Nustep seat 11 UE 14 L2 for gentle hip motion and strengthening  Lower trunk rotations with lumbar spine/sacrum contact to mat table x20 Open book sidelying x12 bilat Straight leg hip hinge with dowel needed for understanding of technique with good carry over x12; with 20# 2x 10 with good carry over of cuing for glute activation  Mini squat with abd slider 2x 10 bilat with min  cuing throughout for technique with good carry  Piriformis stretch 30sec bilat   Manual STM with trigger point release to R sup glute max/med L 3-5 CPA G2 30sec bout x4; G3 30sec bout x4 UPA R for L openning with subj analgesic affect 30sec x4  PATIENT EDUCATION:  Education details: Patient was educated on diagnosis, anatomy and pathology involved, prognosis, role of PT, and was given an HEP, demonstrating exercise with proper form following verbal and tactile cues, and was  given a paper hand out to continue exercise at home. Pt was educated on and agreed to plan of care.  Person educated: Patient Education method: Explanation, Demonstration, Tactile cues, Verbal cues, and Handouts Education comprehension: verbalized understanding, returned demonstration, verbal cues required, and tactile cues required  HOME EXERCISE PROGRAM: Single leg squats, 8-12 reps, 2-3 sets, 3 times per week Hip abduction side planks, 8-12 reps, 1-2 second holds, 2-3 sets, 3 times per week  Side lying gluteus medius stretch, 30 second holds, 2-3 times per day, 7 days per week.  ASSESSMENT:  CLINICAL IMPRESSION: PT continued therex progression for increased hip and core strength with success. PT utilized manual techniques to relieve soft tissue tension and increase lumbo-pelvic dissociation with success. Patient is able to comply with all cuing for proper technique of therex with minimal increase in L sided LBP throughout session that subsides with rest. PT will continue progression as able.    OBJECTIVE IMPAIRMENTS: Abnormal gait, decreased activity tolerance, decreased mobility, impaired flexibility, and pain.   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   REHAB POTENTIAL: Good  CLINICAL DECISION MAKING: Evolving/moderate complexity  EVALUATION COMPLEXITY: Moderate   GOALS: Goals reviewed with patient? No  SHORT TERM GOALS: Target date: 04/06/2022 Pt will be independent with HEP in order to improve strength and symmetry of bilateral LE musculature in order to address deficits with functional movements decrease pain level in RLE. Baseline: 03/09/2022: HEP given Goal status: INITIAL   LONG TERM GOALS: Target date: 05/04/2022  Patient will increase FOTO score to 76 to demonstrate predicted increase in functional mobility to complete ADLs pain free.  Baseline: 59 Goal status: INITIAL  2.  Pt will demonstrate ability to ambulate one flight of 4 stairs without R knee pain for increased community ambulation. Baseline: unable to negotiate 4 stairs without pain Goal status: INITIAL  3.  Pt will report 5/7 nights without R hip pain keeping him awake for improved sleep and quality of life. Baseline: pain disturbs sleep every night Goal status: INITIAL     PLAN:  PT FREQUENCY: 1-2x/week  PT DURATION: 8 weeks  PLANNED INTERVENTIONS: Therapeutic exercises, Therapeutic activity, Neuromuscular re-education, Balance training, Gait training, Patient/Family education, Self Care, Joint mobilization, and Manual therapy  PLAN FOR NEXT SESSION: educated on loose packed position for improved sleeping  Hilda Lias DPT Hilda Lias, PT 03/23/2022, 9:17 AM

## 2022-03-25 ENCOUNTER — Encounter: Payer: BC Managed Care – PPO | Admitting: Physical Therapy

## 2022-03-31 ENCOUNTER — Encounter: Payer: BC Managed Care – PPO | Admitting: Physical Therapy

## 2022-04-07 ENCOUNTER — Encounter: Payer: BC Managed Care – PPO | Admitting: Physical Therapy

## 2022-04-09 ENCOUNTER — Encounter: Payer: Self-pay | Admitting: Physical Therapy

## 2022-04-09 ENCOUNTER — Ambulatory Visit: Payer: BC Managed Care – PPO | Attending: Family Medicine | Admitting: Physical Therapy

## 2022-04-09 ENCOUNTER — Encounter: Payer: BC Managed Care – PPO | Admitting: Physical Therapy

## 2022-04-09 DIAGNOSIS — M25561 Pain in right knee: Secondary | ICD-10-CM | POA: Diagnosis not present

## 2022-04-09 DIAGNOSIS — G8929 Other chronic pain: Secondary | ICD-10-CM | POA: Insufficient documentation

## 2022-04-09 DIAGNOSIS — M25551 Pain in right hip: Secondary | ICD-10-CM | POA: Insufficient documentation

## 2022-04-09 NOTE — Therapy (Signed)
OUTPATIENT PHYSICAL THERAPY LOWER EXTREMITY TREATMENT   Patient Name: Jorge Palmer MRN: [id number] DOB:03/23/1971, 52 y.o., male Today's Date: 04/09/2022   PT End of Session - 04/09/22 0835     Visit Number 4    Number of Visits 17    Date for PT Re-Evaluation 05/04/22    Authorization - Visit Number 4    PT Start Time 0834    PT Stop Time 0912    PT Time Calculation (min) 38 min    Activity Tolerance Patient tolerated treatment well    Behavior During Therapy [facility] for tasks assessed/performed               Past Medical History:  Diagnosis Date   Sarcoidosis    Past Surgical History:  Procedure Laterality Date   COLONOSCOPY WITH PROPOFOL N/A 03/10/2021   Procedure: COLONOSCOPY WITH PROPOFOL;  Surgeon: Wyline Mood, MD;  Location: [facility] ENDOSCOPY;  Service: Gastroenterology;  Laterality: N/A;   microlaminectomy L4-5  01/2016   Patient Active Problem List   Diagnosis Date Noted   Primary osteoarthritis of right knee 03/15/2022   It band syndrome, right 02/01/2022   Migraine headache with aura 01/26/2021   Perennial allergic rhinitis with seasonal variation 06/21/2017   Irritable bowel syndrome (IBS) 06/21/2017   Lumbar spondylosis with myelopathy 01/16/2015   Pulmonary sarcoidosis (HCC) 12/19/2013    PCP: Alba Cory, MD   REFERRING PROVIDER: [name], MD   REFERRING DIAG: M76.31 (ICD-10-CM) - It band syndrome, right   THERAPY DIAG:  Pain in right hip  Chronic pain of right knee  Rationale for Evaluation and Treatment Rehabilitation  ONSET DATE: 6 months  SUBJECTIVE:   SUBJECTIVE STATEMENT: Pt reports minimal pain in the hip today. Some unsteadiness in the R knee, feels like it "clicks when I move around after a period of inactivity", with patient reporting this feels more weak than pain  PERTINENT HISTORY: Patient is a 52 year old male presenting with RLE pain in lateral hip region, medial knee pain, and knee tightness with flexed  functional movements. Pt reports insidious onset of hip and knee pain over 6 months ago and reports pain is aggravated with squatting, bending, stair ambulation and is worse at night. Knee pain is reported with knee bending movements but hip pain is worse at night. Pt reports pain keeps him up about an hour every night but is eventually is able to get to sleep. 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 Pt reports worst pain is hip at night NPRS scale: 5/10.   PAIN:  Are you having pain? Yes: NPRS scale: 5/10 Pain location: Outside of knee, hip   Pain description: dull Aggravating factors: at night in hip , bending in the knee  Relieving factors: Reposition, Meloxicam helping but not completely relive pain.  PRECAUTIONS: None  WEIGHT BEARING RESTRICTIONS: No  FALLS:  Has patient fallen in last 6 months? No  LIVING ENVIRONMENT:  Lives in: House/apartment Stairs:  Internal: Yes , 1 flight to bedroom External: Yes Has following equipment at home: None Feels it in the knee and the hips  Outside and outside    Back spurs cleaned up 5-6 years ago OCCUPATION: Work at home sitting down 80 % of the time  PLOF: Independent  PATIENT GOALS: sleep better/less disturbed sleep, decrease knee tightness/pain and lateral R hip pain with bending movements   OBJECTIVE:   DIAGNOSTIC FINDINGS: See Orders, referred for imaging  PATIENT SURVEYS:  FOTO 59/76  COGNITION: Overall cognitive status: Within functional limits for tasks assessed     SENSATION: WFL  Tingling sensation reported during prone knee bend test lateral lower leg into toes  EDEMA:  None  MUSCLE LENGTH: Hamstrings: Right TBA deg; Left TBA deg Maisie Fus test: Right TBA deg; Left TBA deg  POSTURE: rounded shoulders  PALPATION: Tenderness noted medial joint line of knee, pain is described in this location during single leg squats and stair ambulation. No tenderness noted at lateral  hip/gluteus maximus, however tenderness noted at gluteus medius and gluteus maximus origins from iliac.  LOWER EXTREMITY ROM:  Active ROM with OP Right eval Left eval  Hip flexion [facility]  Hip extension [facility]  Hip abduction [facility] WFL  Hip adduction [facility] WFL  Hip internal rotation [facility] WFL  Hip external rotation [facility] WFL  Knee flexion WNL WNL  Knee extension WNL WNL  Ankle dorsiflexion    Ankle plantarflexion    Ankle inversion    Ankle eversion     (Blank rows = not tested)  LOWER EXTREMITY MMT:  MMT Right eval Left eval  Hip flexion 5 4  Hip extension 4+ 5  Hip abduction 4 pain 5  Hip adduction 5 4  Hip internal rotation 5 5  Hip external rotation 5 5  Knee flexion TBA TBA  Knee extension TBA TBA  Ankle dorsiflexion    Ankle plantarflexion    Ankle inversion    Ankle eversion     (Blank rows = not tested)  LOWER EXTREMITY SPECIAL TESTS:  Ober's Test: positive on R Thessaly test: negative on R Patellar grind test: postive on R  FUNCTIONAL TESTS:  5 times sit to stand: 9.70 sec 10 meter walk test: Self Selected: 8.20 seconds(1.22 m/s); fastest: 5.65 seconds (1.77 m/s) Single Leg Leg Press: R 115 pounds; L 105 pounds Stair ambulation: reciprocal gait pattern with no rail  GAIT: Distance walked: 75 feet Assistive device utilized: None Level of assistance: Complete Independence Comments: Pt demonstrates toe out bilaterally, worse on LLE   TODAY'S TREATMENT                                                                            Ther-Ex Nustep seat 11 UE 14 L2 for gentle hip motion and strengthening  Alt thread the needle x12 with good carry over of initial cuing for technique R hip hike from 3in step 2x 12; with LLE swing x15 R SL hip hinge with cone tap 2x 12 with good carry over  SL mini squat 2x 10 with cuing for technique with good carry over Squat on bosu ball (hardside) 2x 12 with min cuing for positioning with good carry over Piriformis  stretch 30sec bilat    PATIENT EDUCATION:  Education details: Patient was educated on diagnosis, anatomy and pathology involved, prognosis, role of PT, and was given an HEP, demonstrating exercise with proper form following verbal and tactile cues, and was given a paper hand out to continue exercise at home. Pt was educated on and agreed to plan of care.  Person educated: Patient Education method: Explanation, Demonstration, Tactile cues, Verbal cues, and Handouts Education comprehension: verbalized understanding, returned  demonstration, verbal cues required, and tactile cues required  HOME EXERCISE PROGRAM: Single leg squats, 8-12 reps, 2-3 sets, 3 times per week Hip abduction side planks, 8-12 reps, 1-2 second holds, 2-3 sets, 3 times per week  Side lying gluteus medius stretch, 30 second holds, 2-3 times per day, 7 days per week.  ASSESSMENT:  CLINICAL IMPRESSION: PT continued therex progression for increased hip and core strength with success. Addressed midfoot pronation in weight bearing positions, with explanation of contribution to hip pain/ITB syndrome with understanding. Pt with some foot intrinsic discomfort following SL squat and bosu squat (hardside) demonstrating lack of foot intrinsic strength/stability, contributing to midfoot pronation, and hip IR > ITB symptoms. Patient is able to comply with all cuing for proper technique of therex with good effort throughout session. PT will continue progression as able.    OBJECTIVE IMPAIRMENTS: Abnormal gait, decreased activity tolerance, decreased mobility, impaired flexibility, and pain.   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    REHAB POTENTIAL: Good  CLINICAL DECISION MAKING: Evolving/moderate complexity  EVALUATION COMPLEXITY: Moderate   GOALS: Goals reviewed with patient? No  SHORT TERM GOALS: Target date: 04/06/2022 Pt will be independent with HEP in order to improve strength and symmetry of bilateral LE musculature in order to address deficits with functional movements decrease pain level in RLE. Baseline: 03/09/2022: HEP given Goal status: INITIAL   LONG TERM GOALS: Target date: 05/04/2022  Patient will increase FOTO score to 76 to demonstrate predicted increase in functional mobility to complete ADLs pain free.  Baseline: 59 Goal status: INITIAL  2.  Pt will demonstrate ability to ambulate one flight of 4 stairs without R knee pain for increased community ambulation. Baseline: unable to negotiate 4 stairs without pain Goal status: INITIAL  3.  Pt will report 5/7 nights without R hip pain keeping him awake for improved sleep and quality of life. Baseline: pain disturbs sleep every night Goal status: INITIAL     PLAN:  PT FREQUENCY: 1-2x/week  PT DURATION: 8 weeks  PLANNED INTERVENTIONS: Therapeutic exercises, Therapeutic activity, Neuromuscular re-education, Balance training, Gait training, Patient/Family education, Self Care, Joint mobilization, and Manual therapy  PLAN FOR NEXT SESSION: educated on loose packed position for improved sleeping  Hilda Lias DPT Hilda Lias, PT 04/09/2022, 9:14 AM

## 2022-04-12 ENCOUNTER — Ambulatory Visit: Payer: BC Managed Care – PPO | Admitting: Physical Therapy

## 2022-04-15 ENCOUNTER — Encounter: Payer: Self-pay | Admitting: Physical Therapy

## 2022-04-15 ENCOUNTER — Ambulatory Visit: Payer: BC Managed Care – PPO | Admitting: Physical Therapy

## 2022-04-15 DIAGNOSIS — M25551 Pain in right hip: Secondary | ICD-10-CM | POA: Diagnosis not present

## 2022-04-15 DIAGNOSIS — G8929 Other chronic pain: Secondary | ICD-10-CM

## 2022-04-15 DIAGNOSIS — M25561 Pain in right knee: Secondary | ICD-10-CM | POA: Diagnosis not present

## 2022-04-15 NOTE — Therapy (Signed)
OUTPATIENT PHYSICAL THERAPY LOWER EXTREMITY TREATMENT   Patient Name: Jorge Palmer MRN: [id number] DOB:[birth year], 52 y.o., male Today's Date: 04/15/2022   PT End of Session - 04/15/22 0833     Visit Number 5    Number of Visits 17    Date for PT Re-Evaluation 05/04/22    Authorization - Visit Number 5    Progress Note Due on Visit 10    PT Start Time [phone]    PT Stop Time 0900    PT Time Calculation (min) 26 min    Activity Tolerance Patient tolerated treatment well    Behavior During Therapy [facility] for tasks assessed/performed                Past Medical History:  Diagnosis Date   Sarcoidosis    Past Surgical History:  Procedure Laterality Date   COLONOSCOPY WITH PROPOFOL N/A 03/10/2021   Procedure: COLONOSCOPY WITH PROPOFOL;  Surgeon: Wyline Mood, MD;  Location: [facility] ENDOSCOPY;  Service: Gastroenterology;  Laterality: N/A;   microlaminectomy L4-5  01/2016   Patient Active Problem List   Diagnosis Date Noted   Primary osteoarthritis of right knee 03/15/2022   It band syndrome, right 02/01/2022   Migraine headache with aura 01/26/2021   Perennial allergic rhinitis with seasonal variation 06/21/2017   Irritable bowel syndrome (IBS) 06/21/2017   Lumbar spondylosis with myelopathy 01/16/2015   Pulmonary sarcoidosis (HCC) 12/19/2013    PCP: Alba Cory, MD   REFERRING PROVIDER: [name], MD   REFERRING DIAG: M76.31 (ICD-10-CM) - It band syndrome, right   THERAPY DIAG:  Pain in right hip  Chronic pain of right knee  Rationale for Evaluation and Treatment Rehabilitation  ONSET DATE: 6 months  SUBJECTIVE:   SUBJECTIVE STATEMENT: Pt reports minimal pain in the hip today. Patient reports R knee is continuing to feel unsteady in the knee, and that is popping/clicking that feels like there is movement in the knee cap.   PERTINENT HISTORY: Patient is a 52 year old male presenting with RLE pain in lateral hip region, medial knee pain, and knee  tightness with flexed functional movements. Pt reports insidious onset of hip and knee pain over 6 months ago and reports pain is aggravated with squatting, bending, stair ambulation and is worse at night. Knee pain is reported with knee bending movements but hip pain is worse at night. Pt reports pain keeps him up about an hour every night but is eventually is able to get to sleep. 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 Pt reports worst pain is hip at night NPRS scale: 5/10.   PAIN:  Are you having pain? Yes: NPRS scale: 5/10 Pain location: Outside of knee, hip   Pain description: dull Aggravating factors: at night in hip , bending in the knee  Relieving factors: Reposition, Meloxicam helping but not completely relive pain.  PRECAUTIONS: None  WEIGHT BEARING RESTRICTIONS: No  FALLS:  Has patient fallen in last 6 months? No  LIVING ENVIRONMENT:  Lives in: House/apartment Stairs:  Internal: Yes , 1 flight to bedroom External: Yes Has following equipment at home: None Feels it in the knee and the hips  Outside and outside    Back spurs cleaned up 5-6 years ago OCCUPATION: Work at home sitting down 80 % of the time  PLOF: Independent  PATIENT GOALS: sleep better/less disturbed sleep, decrease knee tightness/pain and lateral R hip pain with bending movements   OBJECTIVE:  DIAGNOSTIC FINDINGS: See Orders, referred for imaging  PATIENT SURVEYS:  FOTO 59/76  COGNITION: Overall cognitive status: Within functional limits for tasks assessed     SENSATION: WFL  Tingling sensation reported during prone knee bend test lateral lower leg into toes  EDEMA:  None  MUSCLE LENGTH: Hamstrings: Right TBA deg; Left TBA deg Maisie Fus test: Right TBA deg; Left TBA deg  POSTURE: rounded shoulders  PALPATION: Tenderness noted medial joint line of knee, pain is described in this location during single leg squats and stair ambulation. No  tenderness noted at lateral hip/gluteus maximus, however tenderness noted at gluteus medius and gluteus maximus origins from iliac.  LOWER EXTREMITY ROM:  Active ROM with OP Right eval Left eval  Hip flexion [facility]  Hip extension [facility]  Hip abduction [facility] WFL  Hip adduction [facility] WFL  Hip internal rotation [facility] WFL  Hip external rotation [facility] WFL  Knee flexion WNL WNL  Knee extension WNL WNL  Ankle dorsiflexion    Ankle plantarflexion    Ankle inversion    Ankle eversion     (Blank rows = not tested)  LOWER EXTREMITY MMT:  MMT Right eval Left eval  Hip flexion 5 4  Hip extension 4+ 5  Hip abduction 4 pain 5  Hip adduction 5 4  Hip internal rotation 5 5  Hip external rotation 5 5  Knee flexion TBA TBA  Knee extension TBA TBA  Ankle dorsiflexion    Ankle plantarflexion    Ankle inversion    Ankle eversion     (Blank rows = not tested)  LOWER EXTREMITY SPECIAL TESTS:  Ober's Test: positive on R Thessaly test: negative on R Patellar grind test: postive on R  FUNCTIONAL TESTS:  5 times sit to stand: 9.70 sec 10 meter walk test: Self Selected: 8.20 seconds(1.22 m/s); fastest: 5.65 seconds (1.77 m/s) Single Leg Leg Press: R 115 pounds; L 105 pounds Stair ambulation: reciprocal gait pattern with no rail  GAIT: Distance walked: 75 feet Assistive device utilized: None Level of assistance: Complete Independence Comments: Pt demonstrates toe out bilaterally, worse on LLE   TODAY'S TREATMENT                                                                            Ther-Ex Nustep seat 11 UE 14 L2 for gentle hip motion and strengthening  [place] split squat 2x 10 with good carry over of initial cuing; preventing of knee valgus/midfoot pronation   R Runners step on onto 12in step 2x 10 with better carry over of LE alignment with mirror feedback  RLE leg press 85# 2x 20 with cuing for  Piriformis stretch 30sec RLE    PATIENT EDUCATION:  Education details:  Patient was educated on diagnosis, anatomy and pathology involved, prognosis, role of PT, and was given an HEP, demonstrating exercise with proper form following verbal and tactile cues, and was given a paper hand out to continue exercise at home. Pt was educated on and agreed to plan of care.  Person educated: Patient Education method: Explanation, Demonstration, Tactile cues, Verbal cues, and Handouts Education comprehension: verbalized understanding, returned demonstration, verbal cues required, and tactile cues required  HOME EXERCISE PROGRAM:  Single leg squats, 8-12 reps, 2-3 sets, 3 times per week Hip abduction side planks, 8-12 reps, 1-2 second holds, 2-3 sets, 3 times per week  Side lying gluteus medius stretch, 30 second holds, 2-3 times per day, 7 days per week.  ASSESSMENT:  CLINICAL IMPRESSION: PT continued therex progression for increased hip and core strength with success. Pt with good carry over of VC and mirror feedback of neutral LE alignment without hip IR/knee valgus/midfoot pronation. Patient is able to comply with all cuing for proper technique of therex with good effort throughout session. PT will continue progression as able.    OBJECTIVE IMPAIRMENTS: Abnormal gait, decreased activity tolerance, decreased mobility, impaired flexibility, and pain.   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   REHAB POTENTIAL: Good  CLINICAL DECISION MAKING: Evolving/moderate complexity  EVALUATION COMPLEXITY: Moderate   GOALS: Goals reviewed with patient? No  SHORT TERM GOALS: Target date: 04/06/2022 Pt will be independent with HEP in order to improve strength and symmetry of  bilateral LE musculature in order to address deficits with functional movements decrease pain level in RLE. Baseline: 03/09/2022: HEP given Goal status: INITIAL   LONG TERM GOALS: Target date: 05/04/2022  Patient will increase FOTO score to 76 to demonstrate predicted increase in functional mobility to complete ADLs pain free.  Baseline: 59 Goal status: INITIAL  2.  Pt will demonstrate ability to ambulate one flight of 4 stairs without R knee pain for increased community ambulation. Baseline: unable to negotiate 4 stairs without pain Goal status: INITIAL  3.  Pt will report 5/7 nights without R hip pain keeping him awake for improved sleep and quality of life. Baseline: pain disturbs sleep every night Goal status: INITIAL     PLAN:  PT FREQUENCY: 1-2x/week  PT DURATION: 8 weeks  PLANNED INTERVENTIONS: Therapeutic exercises, Therapeutic activity, Neuromuscular re-education, Balance training, Gait training, Patient/Family education, Self Care, Joint mobilization, and Manual therapy  PLAN FOR NEXT SESSION: educated on loose packed position for improved sleeping  Hilda Lias DPT Hilda Lias, PT 04/15/2022, 8:59 AM

## 2022-04-19 ENCOUNTER — Ambulatory Visit: Payer: BC Managed Care – PPO | Admitting: Physical Therapy

## 2022-04-22 ENCOUNTER — Encounter: Payer: BC Managed Care – PPO | Admitting: Physical Therapy

## 2022-04-26 ENCOUNTER — Encounter: Payer: BC Managed Care – PPO | Admitting: Physical Therapy

## 2022-04-26 ENCOUNTER — Ambulatory Visit: Payer: BC Managed Care – PPO | Admitting: Family Medicine

## 2022-04-29 ENCOUNTER — Encounter: Payer: BC Managed Care – PPO | Admitting: Physical Therapy

## 2022-05-03 ENCOUNTER — Ambulatory Visit: Payer: BC Managed Care – PPO | Admitting: Physical Therapy

## 2022-05-03 ENCOUNTER — Encounter: Payer: Self-pay | Admitting: Physical Therapy

## 2022-05-03 DIAGNOSIS — G8929 Other chronic pain: Secondary | ICD-10-CM | POA: Diagnosis not present

## 2022-05-03 DIAGNOSIS — M25551 Pain in right hip: Secondary | ICD-10-CM | POA: Diagnosis not present

## 2022-05-03 DIAGNOSIS — M25561 Pain in right knee: Secondary | ICD-10-CM | POA: Diagnosis not present

## 2022-05-03 NOTE — Therapy (Signed)
OUTPATIENT PHYSICAL THERAPY LOWER EXTREMITY TREATMENT   Patient Name: Jorge Palmer MRN: [id number] DOB:08/15/1970, 52 y.o., male Today's Date: 05/03/2022   PT End of Session - 05/03/22 0839     Visit Number 6    Number of Visits 17    Date for PT Re-Evaluation 05/04/22    Authorization - Visit Number 6    Progress Note Due on Visit 10    PT Start Time 0835    PT Stop Time 0913    PT Time Calculation (min) 38 min    Activity Tolerance Patient tolerated treatment well    Behavior During Therapy [facility] for tasks assessed/performed                 Past Medical History:  Diagnosis Date   Sarcoidosis    Past Surgical History:  Procedure Laterality Date   COLONOSCOPY WITH PROPOFOL N/A 03/10/2021   Procedure: COLONOSCOPY WITH PROPOFOL;  Surgeon: Wyline Mood, MD;  Location: [facility] ENDOSCOPY;  Service: Gastroenterology;  Laterality: N/A;   microlaminectomy L4-5  01/2016   Patient Active Problem List   Diagnosis Date Noted   Primary osteoarthritis of right knee 03/15/2022   It band syndrome, right 02/01/2022   Migraine headache with aura 01/26/2021   Perennial allergic rhinitis with seasonal variation 06/21/2017   Irritable bowel syndrome (IBS) 06/21/2017   Lumbar spondylosis with myelopathy 01/16/2015   Pulmonary sarcoidosis (HCC) 12/19/2013    PCP: Alba Cory, MD   REFERRING PROVIDER: [name], MD   REFERRING DIAG: M76.31 (ICD-10-CM) - It band syndrome, right   THERAPY DIAG:  Pain in right hip  Chronic pain of right knee  Rationale for Evaluation and Treatment Rehabilitation  ONSET DATE: 6 months  SUBJECTIVE:   SUBJECTIVE STATEMENT: Pt reports minimal pain in the hip today. Feels that his knee is still "crunchy" but is having more pain that is achy at lateral lower leg control/   PERTINENT HISTORY: Patient is a 52 year old male presenting with RLE pain in lateral hip region, medial knee pain, and knee tightness with flexed functional  movements. Pt reports insidious onset of hip and knee pain over 6 months ago and reports pain is aggravated with squatting, bending, stair ambulation and is worse at night. Knee pain is reported with knee bending movements but hip pain is worse at night. Pt reports pain keeps him up about an hour every night but is eventually is able to get to sleep. 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 Pt reports worst pain is hip at night NPRS scale: 5/10.   PAIN:  Are you having pain? Yes: NPRS scale: 5/10 Pain location: Outside of knee, hip   Pain description: dull Aggravating factors: at night in hip , bending in the knee  Relieving factors: Reposition, Meloxicam helping but not completely relive pain.  PRECAUTIONS: None  WEIGHT BEARING RESTRICTIONS: No  FALLS:  Has patient fallen in last 6 months? No  LIVING ENVIRONMENT:  Lives in: House/apartment Stairs:  Internal: Yes , 1 flight to bedroom External: Yes Has following equipment at home: None Feels it in the knee and the hips  Outside and outside    Back spurs cleaned up 5-6 years ago OCCUPATION: Work at home sitting down 80 % of the time  PLOF: Independent  PATIENT GOALS: sleep better/less disturbed sleep, decrease knee tightness/pain and lateral R hip pain with bending movements   OBJECTIVE:   DIAGNOSTIC FINDINGS: See Orders,  referred for imaging  PATIENT SURVEYS:  FOTO 59/76  COGNITION: Overall cognitive status: Within functional limits for tasks assessed     SENSATION: WFL  Tingling sensation reported during prone knee bend test lateral lower leg into toes  EDEMA:  None  MUSCLE LENGTH: Hamstrings: Right TBA deg; Left TBA deg Maisie Fus test: Right TBA deg; Left TBA deg  POSTURE: rounded shoulders  PALPATION: Tenderness noted medial joint line of knee, pain is described in this location during single leg squats and stair ambulation. No tenderness noted at lateral hip/gluteus  maximus, however tenderness noted at gluteus medius and gluteus maximus origins from iliac.  LOWER EXTREMITY ROM:  Active ROM with OP Right eval Left eval  Hip flexion [facility]  Hip extension [facility]  Hip abduction [facility] WFL  Hip adduction [facility] WFL  Hip internal rotation [facility] WFL  Hip external rotation [facility] WFL  Knee flexion WNL WNL  Knee extension WNL WNL  Ankle dorsiflexion    Ankle plantarflexion    Ankle inversion    Ankle eversion     (Blank rows = not tested)  LOWER EXTREMITY MMT:  MMT Right eval Left eval  Hip flexion 5 4  Hip extension 4+ 5  Hip abduction 4 pain 5  Hip adduction 5 4  Hip internal rotation 5 5  Hip external rotation 5 5  Knee flexion TBA TBA  Knee extension TBA TBA  Ankle dorsiflexion    Ankle plantarflexion    Ankle inversion    Ankle eversion     (Blank rows = not tested)  LOWER EXTREMITY SPECIAL TESTS:  Ober's Test: positive on R Thessaly test: negative on R Patellar grind test: postive on R  FUNCTIONAL TESTS:  5 times sit to stand: 9.70 sec 10 meter walk test: Self Selected: 8.20 seconds(1.22 m/s); fastest: 5.65 seconds (1.77 m/s) Single Leg Leg Press: R 115 pounds; L 105 pounds Stair ambulation: reciprocal gait pattern with no rail  GAIT: Distance walked: 75 feet Assistive device utilized: None Level of assistance: Complete Independence Comments: Pt demonstrates toe out bilaterally, worse on LLE   TODAY'S TREATMENT                                                                            Ther-Ex Nustep seat 11 UE 14 L2 for gentle hip motion and strengthening   Heel raise from 6in step with focus on DF lower x12 Duck walk 22ft Heel raise from 6in step with focus on DF lower x12 Duck walk 76ft  Lateral walks in DF 60ft bilat; with GTB 2x 1ft bilat with min cuing for eccentric return of foot  R Runners step on onto 12in step 2x 12 with better carry over of LE alignment with mirror feedback   Alt lunge x12; alt skater  jump with 2sec hold 2x 12 Alt lunge jumps x12 with min cuing for RLE alignment with good carry over  Piriformis stretch 30sec RLE  Ant tib stretch 30sec   PATIENT EDUCATION:  Education details: Patient was educated on diagnosis, anatomy and pathology involved, prognosis, role of PT, and was given an HEP, demonstrating exercise with proper form following verbal and tactile cues, and was given a paper  hand out to continue exercise at home. Pt was educated on and agreed to plan of care.  Person educated: Patient Education method: Explanation, Demonstration, Tactile cues, Verbal cues, and Handouts Education comprehension: verbalized understanding, returned demonstration, verbal cues required, and tactile cues required  HOME EXERCISE PROGRAM: Single leg squats, 8-12 reps, 2-3 sets, 3 times per week Hip abduction side planks, 8-12 reps, 1-2 second holds, 2-3 sets, 3 times per week  Side lying gluteus medius stretch, 30 second holds, 2-3 times per day, 7 days per week.  ASSESSMENT:  CLINICAL IMPRESSION: PT continued therex progression for increased hip and core strength with success. Patient is able to comply with all cuing for proper technique of therex with good effort throughout session. Pt with increased ant tib soreness and pain this session, responds well to ant tib stretch- updated to HEP.  PT will continue progression as able.    OBJECTIVE IMPAIRMENTS: Abnormal gait, decreased activity tolerance, decreased mobility, impaired flexibility, and pain.   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   REHAB POTENTIAL: Good  CLINICAL DECISION MAKING: Evolving/moderate  complexity  EVALUATION COMPLEXITY: Moderate   GOALS: Goals reviewed with patient? No  SHORT TERM GOALS: Target date: 04/06/2022 Pt will be independent with HEP in order to improve strength and symmetry of bilateral LE musculature in order to address deficits with functional movements decrease pain level in RLE. Baseline: 03/09/2022: HEP given Goal status: INITIAL   LONG TERM GOALS: Target date: 05/04/2022  Patient will increase FOTO score to 76 to demonstrate predicted increase in functional mobility to complete ADLs pain free.  Baseline: 59 Goal status: INITIAL  2.  Pt will demonstrate ability to ambulate one flight of 4 stairs without R knee pain for increased community ambulation. Baseline: unable to negotiate 4 stairs without pain Goal status: INITIAL  3.  Pt will report 5/7 nights without R hip pain keeping him awake for improved sleep and quality of life. Baseline: pain disturbs sleep every night Goal status: INITIAL     PLAN:  PT FREQUENCY: 1-2x/week  PT DURATION: 8 weeks  PLANNED INTERVENTIONS: Therapeutic exercises, Therapeutic activity, Neuromuscular re-education, Balance training, Gait training, Patient/Family education, Self Care, Joint mobilization, and Manual therapy  PLAN FOR NEXT SESSION: educated on loose packed position for improved sleeping  Hilda Lias DPT Hilda Lias, PT 05/03/2022, 2:26 PM

## 2022-05-04 ENCOUNTER — Ambulatory Visit: Payer: BC Managed Care – PPO | Admitting: Family Medicine

## 2022-05-04 ENCOUNTER — Encounter: Payer: Self-pay | Admitting: Family Medicine

## 2022-05-04 VITALS — BP 118/78 | HR 60 | Ht 71.0 in | Wt 182.0 lb

## 2022-05-04 DIAGNOSIS — M7631 Iliotibial band syndrome, right leg: Secondary | ICD-10-CM | POA: Diagnosis not present

## 2022-05-04 DIAGNOSIS — M1711 Unilateral primary osteoarthritis, right knee: Secondary | ICD-10-CM

## 2022-05-04 DIAGNOSIS — M4716 Other spondylosis with myelopathy, lumbar region: Secondary | ICD-10-CM

## 2022-05-04 MED ORDER — DICLOFENAC SODIUM 75 MG PO TBEC: 75.0000 mg | DELAYED_RELEASE_TABLET | Freq: Two times a day (BID) | ORAL | 0 refills | Status: DC | PRN

## 2022-05-04 MED ORDER — GABAPENTIN 300 MG PO CAPS: 300.0000 mg | ORAL_CAPSULE | Freq: Every day | ORAL | 0 refills | Status: DC

## 2022-05-04 NOTE — Assessment & Plan Note (Signed)
Given patient's residual symptoms, particularly at the right lower leg at nighttime, lumbosacral etiology of concern which was shared with patient.  He does have a negative straight leg raise and is otherwise intact from a sensorimotor standpoint this region, L5-S1 involvement of consideration.  Will trial gabapentin 3 mg nightly to assess response to further elucidate neuropathic component.  Will closely follow at return.

## 2022-05-04 NOTE — Progress Notes (Signed)
Primary Care / Sports Medicine Office Visit  Patient Information:  Patient ID: Jorge Palmer, male DOB: 07/03/1970 Age: 52 y.o. MRN: [id number]   Jorge Palmer is a pleasant 52 y.o. male presenting with the following:  Chief Complaint  Patient presents with   It band syndrome, right    Still having clicking sound in right knee    Vitals:   05/04/22 1602  BP: 118/78  Pulse: 60  SpO2: 98%   Vitals:   05/04/22 1602  Weight: 182 lb (82.6 kg)  Height: 5\' 11"  (1.803 m)   Body mass index is 25.38 kg/m.  No results found.   Independent interpretation of notes and tests performed by another provider:   None  Procedures performed:   None  Pertinent History, Exam, Impression, and Recommendations:   [name] was seen today for it band syndrome, right.  Primary osteoarthritis of right knee Assessment & Plan: While he has noted improvement in his right lateral hip symptoms, has noted popping, at times associated pain, at the right knee.  Examination with dynamic patellar maltracking during passive range of motion which recreates his stated symptomatology, otherwise minimally tender at the medial patellar facet and medial joint line, no ligamentous laxity with anterior/posterior stressing, valgus/varus stressing benign, equivocal McMurray's.  Did discuss various treatment strategies and he will start as needed diclofenac, close follow-up for diagnostic/therapeutic corticosteroid injection.   Lumbar spondylosis with myelopathy Assessment & Plan: Given patient's residual symptoms, particularly at the right lower leg at nighttime, lumbosacral etiology of concern which was shared with patient.  He does have a negative straight leg raise and is otherwise intact from a sensorimotor standpoint this region, L5-S1 involvement of consideration.  Will trial gabapentin 3 mg nightly to assess response to further elucidate neuropathic component.  Will closely follow at return.   It  band syndrome, right Assessment & Plan: Chronic, improving at right lateral hip, persistent symptoms at right anterolateral knee. See additional assessment(s) for plan details.   Other orders -     Gabapentin; Take 1 capsule (300 mg total) by mouth at bedtime. One tab PO qHS for a week, then BID for a week, then TID. May double weekly to a max of 3,600mg /day  Dispense: 30 capsule; Refill: 0 -     Diclofenac Sodium; Take 1 tablet (75 mg total) by mouth 2 (two) times daily as needed.  Dispense: 60 tablet; Refill: 0     Orders & Medications Meds ordered this encounter  Medications   gabapentin (NEURONTIN) 300 MG capsule    Sig: Take 1 capsule (300 mg total) by mouth at bedtime. One tab PO qHS for a week, then BID for a week, then TID. May double weekly to a max of 3,600mg /day    Dispense:  30 capsule    Refill:  0   diclofenac (VOLTAREN) 75 MG EC tablet    Sig: Take 1 tablet (75 mg total) by mouth 2 (two) times daily as needed.    Dispense:  60 tablet    Refill:  0   No orders of the defined types were placed in this encounter.    Return in about 2 weeks (around 05/18/2022).     [name], MD, [facility]   Primary Care Sports Medicine Primary Care and Sports Medicine at [facility]

## 2022-05-04 NOTE — Assessment & Plan Note (Signed)
Chronic, improving at right lateral hip, persistent symptoms at right anterolateral knee. See additional assessment(s) for plan details.

## 2022-05-04 NOTE — Patient Instructions (Signed)
-  Dose gabapentin nightly and assess for any change in right lower leg symptoms - Can additionally dose diclofenac (after few days of gabapentin dose) up to twice a day for additional pain control - Continue activity and exercise as tolerated - Return for follow-up in 2 weeks - Contact our office for any question/concerns between now and then

## 2022-05-04 NOTE — Assessment & Plan Note (Signed)
While he has noted improvement in his right lateral hip symptoms, has noted popping, at times associated pain, at the right knee.  Examination with dynamic patellar maltracking during passive range of motion which recreates his stated symptomatology, otherwise minimally tender at the medial patellar facet and medial joint line, no ligamentous laxity with anterior/posterior stressing, valgus/varus stressing benign, equivocal McMurray's.  Did discuss various treatment strategies and he will start as needed diclofenac, close follow-up for diagnostic/therapeutic corticosteroid injection.

## 2022-05-20 ENCOUNTER — Ambulatory Visit: Payer: BC Managed Care – PPO | Admitting: Family Medicine

## 2022-05-20 ENCOUNTER — Encounter: Payer: Self-pay | Admitting: Family Medicine

## 2022-05-20 ENCOUNTER — Inpatient Hospital Stay (INDEPENDENT_AMBULATORY_CARE_PROVIDER_SITE_OTHER): Payer: BC Managed Care – PPO | Admitting: Radiology

## 2022-05-20 VITALS — BP 122/78 | HR 78 | Ht 71.0 in

## 2022-05-20 DIAGNOSIS — M7631 Iliotibial band syndrome, right leg: Secondary | ICD-10-CM

## 2022-05-20 DIAGNOSIS — M1711 Unilateral primary osteoarthritis, right knee: Secondary | ICD-10-CM

## 2022-05-20 DIAGNOSIS — M4716 Other spondylosis with myelopathy, lumbar region: Secondary | ICD-10-CM

## 2022-05-20 MED ORDER — TRIAMCINOLONE ACETONIDE 40 MG/ML IJ SUSP
40.0000 mg | Freq: Once | INTRAMUSCULAR | Status: AC
  Administered 2022-05-20: 40 mg via INTRAMUSCULAR

## 2022-05-20 MED ORDER — GABAPENTIN 600 MG PO TABS: 600.0000 mg | ORAL_TABLET | Freq: Every day | ORAL | 1 refills | Status: DC

## 2022-05-20 NOTE — Assessment & Plan Note (Signed)
Given positive response of outer hip/thigh/proximal aspect of right lower leg, primary symptoms are most likely neuropathic in origin stemming from the lumbosacral spine. See additional assessment(s) for plan details.

## 2022-05-20 NOTE — Progress Notes (Signed)
Primary Care / Sports Medicine Office Visit  Patient Information:  Patient ID: EURA RUEDA, male DOB: May 01, 1970 Age: 52 y.o. MRN: [id number]   DAQWON BURKETTE is a pleasant 52 y.o. male presenting with the following:  Chief Complaint  Patient presents with   Follow-up    Right knee pain "clicking"     Vitals:   05/20/22 1419  BP: 122/78  Pulse: 78  SpO2: 97%   Vitals:   05/20/22 1419  Height: 5\' 11"  (1.803 m)   Body mass index is 25.38 kg/m.  No results found.   Independent interpretation of notes and tests performed by another provider:   None  Procedures performed:   Procedure:  Injection of right knee under ultrasound guidance. Ultrasound guidance utilized for anteromedial approach, joint space visualized, no effusion noted Samsung HS60 device utilized with permanent recording / reporting. Verbal informed consent obtained and verified. Skin prepped in a sterile fashion. Ethyl chloride for topical local analgesia.  Completed without difficulty and tolerated well. Medication: triamcinolone acetonide 40 mg/mL suspension for injection 1 mL total and 2 mL lidocaine 1% without epinephrine utilized for needle placement anesthetic Advised to contact for fevers/chills, erythema, induration, drainage, or persistent bleeding.   Pertinent History, Exam, Impression, and Recommendations:   Ibin was seen today for follow-up.  Primary osteoarthritis of right knee Assessment & Plan: Has noted persistent symptomatology, describes internal shifting sensation primarily with deep knee flexion.  No response from gabapentin.  We discussed treatment strategies and he elected to proceed with a diagnostic and therapeutic intra-articular right knee corticosteroid injection.  Post care reviewed, timeline for symptom response relayed.  He is to contact us in 2 to 4 weeks to provide a status update, for recalcitrant symptoms advanced imaging to be considered.  If  improved/improving, ramp of home-based PT and follow-up as needed.  Orders: -     [place] LIMITED JOINT SPACE STRUCTURES UP RIGHT; Future -     Triamcinolone Acetonide  Lumbar spondylosis with myelopathy Assessment & Plan: Chronic condition demonstrating interval improvement, though without resolution, following initiation of gabapentin 300 mg nightly.  This has improved his low back and right leg symptoms, plan for further titration to 600 mg nightly and status update in 2 to 4 weeks.  Orders: -     Gabapentin; Take 1 tablet (600 mg total) by mouth at bedtime.  Dispense: 30 tablet; Refill: 1  It band syndrome, right Assessment & Plan: Given positive response of outer hip/thigh/proximal aspect of right lower leg, primary symptoms are most likely neuropathic in origin stemming from the lumbosacral spine. See additional assessment(s) for plan details.      Orders & Medications Meds ordered this encounter  Medications   triamcinolone acetonide (KENALOG-40) injection 40 mg   gabapentin (NEURONTIN) 600 MG tablet    Sig: Take 1 tablet (600 mg total) by mouth at bedtime.    Dispense:  30 tablet    Refill:  1   Orders Placed This Encounter  Procedures   [place] LIMITED JOINT SPACE STRUCTURES UP RIGHT     No follow-ups on file.     [name], MD, [facility]   Primary Care Sports Medicine Primary Care and Sports Medicine at [facility]

## 2022-05-20 NOTE — Assessment & Plan Note (Signed)
Has noted persistent symptomatology, describes internal shifting sensation primarily with deep knee flexion.  No response from gabapentin.  We discussed treatment strategies and he elected to proceed with a diagnostic and therapeutic intra-articular right knee corticosteroid injection.  Post care reviewed, timeline for symptom response relayed.  He is to contact us in 2 to 4 weeks to provide a status update, for recalcitrant symptoms advanced imaging to be considered.  If improved/improving, ramp of home-based PT and follow-up as needed.

## 2022-05-20 NOTE — Assessment & Plan Note (Signed)
Chronic condition demonstrating interval improvement, though without resolution, following initiation of gabapentin 300 mg nightly.  This has improved his low back and right leg symptoms, plan for further titration to 600 mg nightly and status update in 2 to 4 weeks.

## 2022-05-20 NOTE — Patient Instructions (Signed)
You have just been given a cortisone injection to reduce pain and inflammation. After the injection you may notice immediate relief of pain as a result of the Lidocaine. It is important to rest the area of the injection for 24 to 48 hours after the injection. There is a possibility of some temporary increased discomfort and swelling for up to 72 hours until the cortisone begins to work. If you do have pain, simply rest the joint and use ice. If you can tolerate over the counter medications, you can try Tylenol, Aleve, or Advil for added relief per package instructions. - As above, ice x 20 minutes when home, before bed, and as needed - Perform relative rest with activity x 2 days and gradual return to normal activity - Increase gabapentin to 600 mg nightly - Contact our office at the 2-4-week mark for a status update in regards to right knee/low back/right leg symptoms - Contact us for any questions otherwise between now and then

## 2022-05-26 ENCOUNTER — Encounter: Payer: Self-pay | Admitting: Family Medicine

## 2022-05-27 NOTE — Telephone Encounter (Signed)
Please review.  KP

## 2022-05-31 ENCOUNTER — Other Ambulatory Visit: Payer: Self-pay | Admitting: Family Medicine

## 2022-06-01 NOTE — Telephone Encounter (Signed)
Medication D/C 05/20/22.

## 2022-06-18 ENCOUNTER — Other Ambulatory Visit: Payer: Self-pay | Admitting: Family Medicine

## 2022-06-18 DIAGNOSIS — M1711 Unilateral primary osteoarthritis, right knee: Secondary | ICD-10-CM

## 2022-06-18 DIAGNOSIS — M2391 Unspecified internal derangement of right knee: Secondary | ICD-10-CM

## 2022-06-26 ENCOUNTER — Ambulatory Visit: Payer: BC Managed Care – PPO

## 2022-10-14 DIAGNOSIS — U071 COVID-19: Secondary | ICD-10-CM | POA: Diagnosis not present

## 2022-10-14 DIAGNOSIS — Z20822 Contact with and (suspected) exposure to covid-19: Secondary | ICD-10-CM | POA: Diagnosis not present

## 2022-10-20 ENCOUNTER — Other Ambulatory Visit: Payer: Self-pay | Admitting: Family Medicine

## 2022-10-20 ENCOUNTER — Telehealth: Payer: Self-pay

## 2022-10-20 ENCOUNTER — Telehealth: Payer: Self-pay | Admitting: Family Medicine

## 2022-10-20 DIAGNOSIS — D860 Sarcoidosis of lung: Secondary | ICD-10-CM

## 2022-10-20 DIAGNOSIS — L663 Perifolliculitis capitis abscedens: Secondary | ICD-10-CM | POA: Diagnosis not present

## 2022-10-20 DIAGNOSIS — L309 Dermatitis, unspecified: Secondary | ICD-10-CM | POA: Diagnosis not present

## 2022-10-20 DIAGNOSIS — D2271 Melanocytic nevi of right lower limb, including hip: Secondary | ICD-10-CM | POA: Diagnosis not present

## 2022-10-20 DIAGNOSIS — D225 Melanocytic nevi of trunk: Secondary | ICD-10-CM | POA: Diagnosis not present

## 2022-10-20 DIAGNOSIS — D2261 Melanocytic nevi of right upper limb, including shoulder: Secondary | ICD-10-CM | POA: Diagnosis not present

## 2022-10-20 DIAGNOSIS — D2262 Melanocytic nevi of left upper limb, including shoulder: Secondary | ICD-10-CM | POA: Diagnosis not present

## 2022-10-20 NOTE — Telephone Encounter (Signed)
 Noted.  Will close encounter.  

## 2022-10-20 NOTE — Telephone Encounter (Signed)
Referral Request - Has patient seen PCP for this complaint? yes *If NO, is insurance requiring patient see PCP for this issue before PCP can refer them? Referral for which specialty: pulmonary Preferred provider/office: [facility] Reason for referral: fu for sarcoidosis

## 2022-10-20 NOTE — Telephone Encounter (Signed)
Received below message form Glory Buff, RN:  Hey! I received a message from this pt wanting to get his appts rescheduled to follow up with Dr. Jayme Cloud again. He last saw her in 2021 and didn't keep further follow up appts due to covid. He was being seen for sarcoidosis and lung nodules. Can you help get him back on the schedule?    Patient will need referral from PCP. Lm to make patient aware.

## 2022-11-11 ENCOUNTER — Ambulatory Visit
Admission: RE | Admit: 2022-11-11 | Discharge: 2022-11-11 | Disposition: A | Discharge status: Home / Self Care | Payer: BC Managed Care – PPO | Source: Ambulatory Visit | Attending: Pulmonary Disease | Admitting: Pulmonary Disease

## 2022-11-11 ENCOUNTER — Encounter: Payer: Self-pay | Admitting: Pulmonary Disease

## 2022-11-11 ENCOUNTER — Ambulatory Visit: Payer: BC Managed Care – PPO | Admitting: Pulmonary Disease

## 2022-11-11 ENCOUNTER — Ambulatory Visit
Admission: RE | Admit: 2022-11-11 | Discharge: 2022-11-11 | Disposition: A | Discharge status: Home / Self Care | Payer: BC Managed Care – PPO | Source: Home / Self Care | Attending: Pulmonary Disease | Admitting: Pulmonary Disease

## 2022-11-11 VITALS — BP 120/78 | HR 64 | Temp 98.3°F | Ht 71.0 in | Wt 191.2 lb

## 2022-11-11 DIAGNOSIS — D860 Sarcoidosis of lung: Secondary | ICD-10-CM | POA: Diagnosis not present

## 2022-11-11 DIAGNOSIS — R052 Subacute cough: Secondary | ICD-10-CM | POA: Diagnosis not present

## 2022-11-11 DIAGNOSIS — R0602 Shortness of breath: Secondary | ICD-10-CM | POA: Diagnosis not present

## 2022-11-11 DIAGNOSIS — J45909 Unspecified asthma, uncomplicated: Secondary | ICD-10-CM | POA: Diagnosis not present

## 2022-11-11 DIAGNOSIS — J40 Bronchitis, not specified as acute or chronic: Secondary | ICD-10-CM | POA: Diagnosis not present

## 2022-11-11 DIAGNOSIS — R918 Other nonspecific abnormal finding of lung field: Secondary | ICD-10-CM | POA: Diagnosis not present

## 2022-11-11 DIAGNOSIS — R590 Localized enlarged lymph nodes: Secondary | ICD-10-CM | POA: Diagnosis not present

## 2022-11-11 LAB — NITRIC OXIDE: Nitric Oxide: 30

## 2022-11-11 MED ORDER — BREO ELLIPTA 100-25 MCG/ACT IN AEPB: 1.0000 | INHALATION_SPRAY | Freq: Every day | RESPIRATORY_TRACT | 11 refills | Status: DC

## 2022-11-11 MED ORDER — LEVALBUTEROL TARTRATE 45 MCG/ACT IN AERO: 2.0000 | INHALATION_SPRAY | RESPIRATORY_TRACT | 2 refills | Status: DC | PRN

## 2022-11-11 NOTE — Progress Notes (Signed)
Subjective:    Patient ID: Jorge Palmer, male    DOB: 1970/05/15, 52 y.o.   MRN: [id number]  Patient Care Team: Alba Cory, MD as PCP - General (Family Medicine) Patricia Nettle, MD as Consulting Physician (Orthopedic Surgery) Debbrah Alar, MD (Dermatology) Salena Saner, MD as Consulting Physician (Pulmonary Disease)  Chief Complaint  Patient presents with   Follow-up    Covid 1 month ago. SOB and cough when he takes a deep breath. No wheezing.    HPI Jorge Palmer is a 52 year old remote former smoker with minimal tobacco exposure and a prior history of sarcoidosis, who presents for evaluation of shortness of breath and cough after COVID-19 1 month ago.  He is rereferred by Dr. Cristino Martes.  The patient had been evaluated in this practice in the past by Dr. Max Fickle and was seen by me in June 2021 however did not follow-up after that visit.  In any event the patient states that 1 month ago he had COVID, he did not require hospitalization but did have some issues with shortness of breath and cough that developed during that time.  He feels that since he had that illness every time he tries to breathe deeply he starts coughing.  When he had COVID the most salient symptoms were shortness of breath and tiredness.  The tiredness has resolved.  He does not endorse reflux symptoms.  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  He has not had any fevers, chills or sweats since he had COVID a month ago.  He has not noticed any wheezing.  No orthopnea or paroxysmal nocturnal dyspnea.  No lower extremity edema and no calf tenderness.  Recall that the patient was diagnosed with sarcoidosis in June 2024 at [facility] by bronchoscopy with endobronchial ultrasound and biopsy of station 7 lymph node which showed noncaseating granulomas.  He also had a left lower lobe transbronchial biopsy that was negative and a  BAL that was negative.  His cultures at that time were also negative to include AFB and bacterial cultures.  There was a contaminant of a fungal organism but not active infection.  His CT at that time showed pulmonary nodules and partly calcified mediastinal adenopathy.  He has no military history.  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  He does not have any pets in the home, no unusual hobbies.  DATA 09/04/2019 CT chest without contrast: Numerous small pulmonary nodules many which are calcified.  Slightly enlarged when compared to prior examination of 09/19/2012, in particular clustered nodules in the posterior right upper lobe and the perihilar right lung.  Largest clearly benign and calcified nodules measure approximately 8 mm.  Findings consistent with pulmonary sarcoidosis that has progressed in comparison to examination dated 2014.  No evidence of associated fibrosis, no suspicious pulmonary nodules.  Numerous enlarged calcified mediastinal and hilar lymph nodes unchanged when compared to prior examination of 2014.  These all in keeping with diagnosis of sarcoidosis. 11/16/2019 PFTs: FEV1 4.06 L or 97% predicted, FVC 5.46 L or 102% predicted, FEV1/FVC 78%, no bronchodilator response, lung volumes normal, diffusion capacity normal, normal study.  Review of Systems A 10 point review of systems was performed and it is as noted above otherwise negative.   Past Medical History:  Diagnosis Date   Sarcoidosis     Past  Surgical History:  Procedure Laterality Date   COLONOSCOPY WITH PROPOFOL N/A 03/10/2021   Procedure: COLONOSCOPY WITH PROPOFOL;  Surgeon: Wyline Mood, MD;  Location: [facility] ENDOSCOPY;  Service: Gastroenterology;  Laterality: N/A;   microlaminectomy L4-5  01/2016   VIDEO BRONCHOSCOPY WITH ENDOBRONCHIAL ULTRASOUND N/A 09/2012   Station 7 LN Bx - [facility]    Patient Active Problem List   Diagnosis Date Noted   Primary osteoarthritis of right knee 03/15/2022    It band syndrome, right 02/01/2022   Migraine headache with aura 01/26/2021   Perennial allergic rhinitis with seasonal variation 06/21/2017   Irritable bowel syndrome (IBS) 06/21/2017   Lumbar spondylosis with myelopathy 01/16/2015   Pulmonary sarcoidosis (HCC) 12/19/2013    Family History  Problem Relation Age of Onset   Cancer Maternal Grandfather        lung   Heart disease Maternal Grandfather    Cancer Maternal Grandmother        ovarian   Rheum arthritis Maternal Grandmother    Cancer Father        melanoma   Migraines Sister    Parkinson's disease Paternal Grandmother    Liver disease Paternal Grandfather     Social History   Tobacco Use   Smoking status: Former    Current packs/day: 0.00    Average packs/day: 0.5 packs/day for 6.7 years (3.4 ttl pk-yrs)    Types: Cigarettes    Start date: 04/05/1997    Quit date: 12/20/2003    Years since quitting: 18.9   Smokeless tobacco: Never  Substance Use Topics   Alcohol use: No    Comment: since 2017 never a heavy drinker    Allergies  Allergen Reactions   Sulfa Antibiotics     Rash-as an infant    Current Meds  Medication Sig   aspirin-acetaminophen-caffeine (EXCEDRIN MIGRAINE) 250-250-65 MG tablet Take 1 tablet by mouth every 6 (six) hours as needed.   B-COMPLEX-C PO Take 1 capsule by mouth daily.   ibuprofen (ADVIL) 200 MG tablet Take by mouth.   ketoconazole (NIZORAL) 2 % shampoo Apply topically daily.    Immunization History  Administered Date(s) Administered   Influenza Split 12/19/2012   Influenza,inj,Quad PF,6+ Mos 12/19/2013, 02/17/2015, 01/26/2021, 02/01/2022   Influenza-Unspecified 01/04/2016   PFIZER(Purple Top)SARS-COV-2 Vaccination 06/19/2019, 07/16/2019   PNEUMOCOCCAL CONJUGATE-20 01/26/2021   Tdap 02/10/2019        Objective:   BP 120/78 (BP Location: Left Arm, Cuff Size: Normal)   Pulse 64   Temp 98.3 F (36.8 C)   Ht 5\' 11"  (1.803 m)   Wt 191 lb 3.2 oz (86.7 kg)   SpO2 95%   BMI  26.67 kg/m   SpO2: 95 % O2 Device: None (Room air)  GENERAL: Well-developed, well-nourished gentleman, fully ambulatory.  No distress.  No conversational dyspnea. HEAD: Normocephalic, atraumatic.  EYES: Pupils equal, round, reactive to light.  No scleral icterus.  MOUTH: Dentition intact, oral mucosa moist.  No thrush. NECK: Supple. No thyromegaly. Trachea midline. No JVD.  No adenopathy. PULMONARY: Good air entry bilaterally.  Coarse, otherwise, no adventitious sounds. CARDIOVASCULAR: S1 and S2. Regular rate and rhythm.  No rubs, murmurs or gallops heard. ABDOMEN: Benign. MUSCULOSKELETAL: No joint deformity, no clubbing, no edema.  NEUROLOGIC: No overt focal deficit, no gait disturbance, speech is fluent. SKIN: Intact,warm,dry.  On limited exam, no rashes noted.  No skin lesions. PSYCH: Mood and behavior normal.  Lab Results  Component Value Date   NITRICOXIDE 30 11/11/2022    Assessment &  Plan:     ICD-10-CM   1. Subacute cough  R05.2 Nitric oxide    DG Chest 2 View   Suspect postinfectious cough Cannot exclude cough variant asthma    2. Pulmonary sarcoidosis (HCC)  D86.0 DG Chest 2 View    Pulmonary Function Test [facility] Only   Reassess with PFTs Chest x-ray PA and lateral    3. Uncomplicated asthma, unspecified asthma severity, unspecified whether persistent  J45.909 Pulmonary Function Test [facility] Only   Evidence of mild type II inflammation PFTs Trial of Breo Ellipta As needed levalbuterol     Orders Placed This Encounter  Procedures   DG Chest 2 View    Standing Status:   Future    Number of Occurrences:   1    Standing Expiration Date:   11/11/2023    Order Specific Question:   Reason for Exam (SYMPTOM  OR DIAGNOSIS REQUIRED)    Answer:   Subacute cough, Sarcoidosis    Order Specific Question:   Preferred imaging location?    Answer:   [facility]   Nitric oxide   Pulmonary Function Test [facility] Only    Standing Status:   Future    Standing Expiration Date:    11/11/2023    Order Specific Question:   Full PFT: includes the following: basic spirometry, spirometry pre & post bronchodilator, diffusion capacity (DLCO), lung volumes    Answer:   Full PFT    Order Specific Question:   This test can only be performed at    Answer:   [facility]    Meds ordered this encounter  Medications   BREO ELLIPTA 100-25 MCG/ACT AEPB    Sig: Inhale 1 puff into the lungs daily.    Dispense:  28 each    Refill:  11   levalbuterol (XOPENEX HFA) 45 MCG/ACT inhaler    Sig: Inhale 2 puffs into the lungs every 4 (four) hours as needed for wheezing.    Dispense:  1 each    Refill:  2   We will see the patient in follow-up in 6 to 8 weeks time he is to contact us prior to that time should any new difficulties arise.  Gailen Shelter, MD Advanced Bronchoscopy PCCM [facility]    *This note was dictated using voice recognition software/Dragon.  Despite best efforts to proofread, errors can occur which can change the meaning. Any transcriptional errors that result from this process are unintentional and may not be fully corrected at the time of dictation.

## 2022-11-11 NOTE — Patient Instructions (Addendum)
We are going to reassess your sarcoid.  The level of inflammation in your airway was mildly increased today this is consistent with asthma.  We are going to get a chest x-ray today.  We will schedule breathing tests.  We are starting you on an inhaler called Breo Ellipta which is 1 puff daily make sure you rinse your mouth well after you use it.  You will get an inhaler that can be used this emergency inhaler which is called levoalbuterol (Xopenex), you can use this 2 puffs up to 4 times a day if you notice any shortness of breath or unrelenting cough.  We will see you back in 6 to 8 weeks time to make sure that this is helping your issue.

## 2022-11-13 ENCOUNTER — Encounter: Payer: Self-pay | Admitting: Pulmonary Disease

## 2022-11-15 ENCOUNTER — Telehealth: Payer: Self-pay | Admitting: Pulmonary Disease

## 2022-11-15 NOTE — Telephone Encounter (Signed)
Lets try Advair 250/50, 1 inhalation twice a day, make sure he rinses his mouth well after use.

## 2022-11-15 NOTE — Telephone Encounter (Signed)
Received alternative request from [facility] for Breo. Virgel Bouquet is not covered, preferred alternative is Advair.  Dr. Jayme Cloud, please advise. Thanks

## 2022-11-15 NOTE — Telephone Encounter (Signed)
Lm x1 for patient.  

## 2022-11-16 MED ORDER — FLUTICASONE-SALMETEROL 250-50 MCG/ACT IN AEPB: 1.0000 | INHALATION_SPRAY | Freq: Two times a day (BID) | RESPIRATORY_TRACT | 6 refills | Status: DC

## 2022-11-16 NOTE — Telephone Encounter (Signed)
Spoke to patient. He is aware that Virgel Bouquet is not covered by insurance.  He is aware that Advair has been sent to preferred pharmacy. Nothing further needed.

## 2022-11-18 ENCOUNTER — Telehealth: Payer: Self-pay

## 2022-11-18 DIAGNOSIS — D860 Sarcoidosis of lung: Secondary | ICD-10-CM

## 2022-11-18 NOTE — Telephone Encounter (Signed)
-----   Message from Sarina Ser sent at 11/18/2022  4:38 PM EDT ----- Chest x-ray shows some changes consistent with bronchitis/asthma and the bilateral lung nodules.  Since it has been a while I recommend a CT chest with contrast to reevaluate his sarcoid.  I also recommend obtaining an ACE level (blood test) this will help determine the activity of the sarcoid.

## 2022-11-18 NOTE — Telephone Encounter (Signed)
I have notified the patient and placed the orders for the CT and blood work.  Nothing further needed.

## 2022-11-26 ENCOUNTER — Other Ambulatory Visit
Admission: RE | Admit: 2022-11-26 | Discharge: 2022-11-26 | Disposition: A | Discharge status: Home / Self Care | Payer: BC Managed Care – PPO | Source: Home / Self Care | Attending: Pulmonary Disease | Admitting: Pulmonary Disease

## 2022-11-26 ENCOUNTER — Ambulatory Visit
Admission: RE | Admit: 2022-11-26 | Discharge: 2022-11-26 | Disposition: A | Discharge status: Home / Self Care | Payer: BC Managed Care – PPO | Source: Ambulatory Visit | Attending: Family Medicine | Admitting: Family Medicine

## 2022-11-26 DIAGNOSIS — D869 Sarcoidosis, unspecified: Secondary | ICD-10-CM | POA: Diagnosis not present

## 2022-11-26 DIAGNOSIS — D860 Sarcoidosis of lung: Secondary | ICD-10-CM | POA: Insufficient documentation

## 2022-11-26 DIAGNOSIS — R0600 Dyspnea, unspecified: Secondary | ICD-10-CM | POA: Diagnosis not present

## 2022-11-26 DIAGNOSIS — R918 Other nonspecific abnormal finding of lung field: Secondary | ICD-10-CM | POA: Diagnosis not present

## 2022-11-26 DIAGNOSIS — R590 Localized enlarged lymph nodes: Secondary | ICD-10-CM | POA: Diagnosis not present

## 2022-11-26 MED ORDER — IOHEXOL 300 MG/ML  SOLN
75.0000 mL | Freq: Once | INTRAMUSCULAR | Status: AC | PRN
  Administered 2022-11-26: 75 mL via INTRAVENOUS

## 2022-11-26 NOTE — Addendum Note (Signed)
Addended by: Lonell Face C on: 11/26/2022 09:05 AM   Modules accepted: Orders

## 2022-11-27 LAB — ANGIOTENSIN CONVERTING ENZYME: Angiotensin-Converting Enzyme: 60 U/L (ref 14–82)

## 2022-11-30 ENCOUNTER — Ambulatory Visit: Payer: BC Managed Care – PPO | Attending: Pulmonary Disease

## 2022-11-30 DIAGNOSIS — D860 Sarcoidosis of lung: Secondary | ICD-10-CM | POA: Diagnosis not present

## 2022-11-30 DIAGNOSIS — J45909 Unspecified asthma, uncomplicated: Secondary | ICD-10-CM | POA: Insufficient documentation

## 2022-11-30 LAB — PULMONARY FUNCTION TEST ARMC ONLY
DL/VA % pred: 95 %
DL/VA: 4.21 ml/min/mmHg/L
DLCO unc % pred: 93 %
DLCO unc: 28.36 ml/min/mmHg
FEF 25-75 Post: 3.3 L/s
FEF 25-75 Pre: 3.57 L/s
FEF2575-%Change-Post: -7 %
FEF2575-%Pred-Post: 92 %
FEF2575-%Pred-Pre: 100 %
FEV1-%Change-Post: -3 %
FEV1-%Pred-Post: 92 %
FEV1-%Pred-Pre: 95 %
FEV1-Post: 3.75 L
FEV1-Pre: 3.88 L
FEV1FVC-%Change-Post: 1 %
FEV1FVC-%Pred-Pre: 100 %
FEV6-%Change-Post: -3 %
FEV6-%Pred-Post: 93 %
FEV6-%Pred-Pre: 97 %
FEV6-Post: 4.73 L
FEV6-Pre: 4.91 L
FEV6FVC-%Change-Post: 0 %
FEV6FVC-%Pred-Post: 103 %
FEV6FVC-%Pred-Pre: 102 %
FVC-%Change-Post: -4 %
FVC-%Pred-Post: 90 %
FVC-%Pred-Pre: 94 %
FVC-Post: 4.75 L
FVC-Pre: 4.96 L
Post FEV1/FVC ratio: 79 %
Post FEV6/FVC ratio: 100 %
Pre FEV1/FVC ratio: 78 %
Pre FEV6/FVC Ratio: 99 %
RV % pred: 86 %
RV: 1.81 L
TLC % pred: 94 %
TLC: 6.76 L

## 2022-11-30 MED ORDER — ALBUTEROL SULFATE (2.5 MG/3ML) 0.083% IN NEBU
2.5000 mg | INHALATION_SOLUTION | Freq: Once | RESPIRATORY_TRACT | Status: AC
  Administered 2022-11-30: 2.5 mg via RESPIRATORY_TRACT
  Filled 2022-11-30: qty 3

## 2023-01-04 ENCOUNTER — Ambulatory Visit: Payer: BC Managed Care – PPO | Admitting: Pulmonary Disease

## 2023-01-04 ENCOUNTER — Encounter: Payer: Self-pay | Admitting: Pulmonary Disease

## 2023-01-04 VITALS — BP 100/64 | HR 56 | Temp 98.1°F | Ht 71.0 in | Wt 189.4 lb

## 2023-01-04 DIAGNOSIS — J683 Other acute and subacute respiratory conditions due to chemicals, gases, fumes and vapors: Secondary | ICD-10-CM | POA: Diagnosis not present

## 2023-01-04 DIAGNOSIS — R052 Subacute cough: Secondary | ICD-10-CM

## 2023-01-04 DIAGNOSIS — R0602 Shortness of breath: Secondary | ICD-10-CM

## 2023-01-04 DIAGNOSIS — R490 Dysphonia: Secondary | ICD-10-CM | POA: Diagnosis not present

## 2023-01-04 DIAGNOSIS — D860 Sarcoidosis of lung: Secondary | ICD-10-CM | POA: Diagnosis not present

## 2023-01-04 DIAGNOSIS — N289 Disorder of kidney and ureter, unspecified: Secondary | ICD-10-CM

## 2023-01-04 LAB — NITRIC OXIDE: Nitric Oxide: 26

## 2023-01-04 NOTE — Patient Instructions (Signed)
We discussed that you have a small lesion in the kidney that will need follow-up with a CT of the abdomen this can be ordered by your primary care physician at the time of your physical.  I have referred you to the ear nose and throat specialist to evaluate you for your hoarseness.  We are getting a heart test to evaluate your shortness of breath and to make sure that there is no involvement of sarcoid in the heart.  You can take a break from the [place].  Use the albuterol (rescue inhaler) as needed.  We will see you in follow-up in 4 months time call sooner should any new problems arise.

## 2023-01-04 NOTE — Progress Notes (Signed)
Subjective:    Patient ID: Jorge Palmer, male    DOB: November 18, 1970, 52 y.o.   MRN: [id number]  Patient Care Team: Alba Cory, MD as PCP - General (Family Medicine) Patricia Nettle, MD as Consulting Physician (Orthopedic Surgery) Debbrah Alar, MD (Dermatology) Salena Saner, MD as Consulting Physician (Pulmonary Disease)  Chief Complaint  Patient presents with   Follow-up    Voice gets weak by the end of the day. Occasional cough.     HPI Jorge Palmer is a 52 year old remote former smoker with minimal tobacco exposure and a prior history of sarcoidosis, who presents for evaluation of shortness of breath and cough after COVID-19 in July 2024.recall that the patient had been evaluated in this practice in the past by Dr. Max Fickle and was seen by me in June 2021 however did not follow-up after that visit.  I reevaluated him 11 November 2022 and at that time he had had COVID-19 the month prior he had recurrence of shortness of breath and dry nonproductive cough since that illness.  When he had COVID the most salient symptoms were shortness of breath and tiredness.  The tiredness has resolved.  He does not endorse reflux symptoms.  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  This has improved somewhat.  He has not had any fevers, chills or sweats since he had COVID.  He has not noticed any wheezing.  No orthopnea or paroxysmal nocturnal dyspnea.  No lower extremity edema and no calf tenderness.  He does note hoarseness and a weak voice towards the end of the day.  We gave him a trial of Wixela 250/50 twice a day at his visit here in August.  He has not noticed that this has made a significant change.  The hoarseness and weakness of voice was present even before the [facility] therapy.  He has not used as needed albuterol though he has an inhaler available.   Recall that the patient was diagnosed with  sarcoidosis in June 2014 at [facility] by bronchoscopy with endobronchial ultrasound and biopsy of station 7 lymph node which showed noncaseating granulomas.  He also had a left lower lobe transbronchial biopsy that was negative and a BAL that was negative.  His cultures at that time were also negative to include AFB and bacterial cultures.  There was a contaminant of a fungal organism but not active infection.  His CT at that time showed pulmonary nodules and partly calcified mediastinal adenopathy.   At his visit in August he had angiotensin-converting enzyme drawn and this was 60 units/L which was normal.  He had a PFT performed on 30 November 2022 that was normal.  CT chest was repeated on 23 August and this showed fairly stable sarcoidosis changes with no acute pulmonary disease noted.  Stable coarsely centrally calcified mediastinal and bilateral hilar lymphadenopathy compatible with sarcoidosis.  There was also a subcentimeter hypodense posterior upper right renal cortical lesion too small to characterize for which follow-up imaging is recommended.  DATA 10/03/2012 EBUS [facility] station 7 node ([facility]): Noncaseating granulomas 09/04/2019 CT chest without contrast: Numerous small pulmonary nodules many which are calcified.  Slightly enlarged when compared to prior examination of 09/19/2012, in particular clustered nodules in the posterior right upper lobe and the perihilar right lung.  Largest clearly benign and calcified nodules measure approximately 8 mm.  Findings consistent with pulmonary  sarcoidosis that has progressed in comparison to examination dated 2014.  No evidence of associated fibrosis, no suspicious pulmonary nodules.  Numerous enlarged calcified mediastinal and hilar lymph nodes unchanged when compared to prior examination of 2014.  These all in keeping with diagnosis of sarcoidosis. 09/19/2019 ACE level: 69 units/L (normal). 11/16/2019 PFTs: FEV1 4.06 L or 97% predicted, FVC 5.46 L or 102% predicted,  FEV1/FVC 78%, no bronchodilator response, lung volumes normal, diffusion capacity normal, normal study. 11/26/2022 ACE level: 60 units/L (normal). 11/30/2022 PFTs: FEV1 3.88 L or 95% predicted, FVC 4.96 L or 94% predicted, FEV1/FVC 78%, no bronchodilator response.  Lung volumes normal, diffusion capacity normal.  Normal study.  Review of Systems A 10 point review of systems was performed and it is as noted above otherwise negative.   Patient Active Problem List   Diagnosis Date Noted   Primary osteoarthritis of right knee 03/15/2022   It band syndrome, right 02/01/2022   Migraine headache with aura 01/26/2021   Perennial allergic rhinitis with seasonal variation 06/21/2017   Irritable bowel syndrome (IBS) 06/21/2017   Lumbar spondylosis with myelopathy 01/16/2015   Pulmonary sarcoidosis (HCC) 12/19/2013    Social History   Tobacco Use   Smoking status: Former    Current packs/day: 0.00    Average packs/day: 0.5 packs/day for 6.7 years (3.4 ttl pk-yrs)    Types: Cigarettes    Start date: 04/05/1997    Quit date: 12/20/2003    Years since quitting: 19.0   Smokeless tobacco: Never  Substance Use Topics   Alcohol use: No    Comment: since 2017 never a heavy drinker    Allergies  Allergen Reactions   Sulfa Antibiotics     Rash-as an infant    Current Meds  Medication Sig   aspirin-acetaminophen-caffeine (EXCEDRIN MIGRAINE) 250-250-65 MG tablet Take 1 tablet by mouth every 6 (six) hours as needed.   B-COMPLEX-C PO Take 1 capsule by mouth daily.   fluticasone-salmeterol (ADVAIR) 250-50 MCG/ACT AEPB Inhale 1 puff into the lungs in the morning and at bedtime.   ibuprofen (ADVIL) 200 MG tablet Take by mouth.   ketoconazole (NIZORAL) 2 % shampoo Apply topically daily.   levalbuterol (XOPENEX HFA) 45 MCG/ACT inhaler Inhale 2 puffs into the lungs every 4 (four) hours as needed for wheezing.    Immunization History  Administered Date(s) Administered   Influenza Split 12/19/2012    Influenza,inj,Quad PF,6+ Mos 12/19/2013, 02/17/2015, 01/26/2021, 02/01/2022   Influenza-Unspecified 01/04/2016   PFIZER(Purple Top)SARS-COV-2 Vaccination 06/19/2019, 07/16/2019   PNEUMOCOCCAL CONJUGATE-20 01/26/2021   Tdap 02/10/2019        Objective:   BP 100/64 (BP Location: Left Arm, Patient Position: Sitting, Cuff Size: Normal)   Pulse (!) 56   Temp 98.1 F (36.7 C) (Temporal)   Ht 5\' 11"  (1.803 m)   Wt 189 lb 6.4 oz (85.9 kg)   SpO2 98%   BMI 26.42 kg/m   SpO2: 98 %  GENERAL: Well-developed, well-nourished gentleman, fully ambulatory.  No distress.  No conversational dyspnea.  Phonation appears normal. HEAD: Normocephalic, atraumatic.  EYES: Pupils equal, round, reactive to light.  No scleral icterus.  MOUTH: Dentition intact, oral mucosa moist.  No thrush. NECK: Supple. No thyromegaly. Trachea midline. No JVD.  No adenopathy. PULMONARY: Good air entry bilaterally.  Coarse, otherwise, no adventitious sounds. CARDIOVASCULAR: S1 and S2. Regular rate and rhythm.  No rubs, murmurs or gallops heard. ABDOMEN: Benign. MUSCULOSKELETAL: No joint deformity, no clubbing, no edema.  NEUROLOGIC: No overt focal deficit, no  gait disturbance, speech is fluent. SKIN: Intact,warm,dry.  On limited exam, no rashes noted.  No skin lesions. PSYCH: Mood and behavior normal.  Lab Results  Component Value Date   NITRICOXIDE 26 01/04/2023   Assessment & Plan:     ICD-10-CM   1. Pulmonary sarcoidosis (HCC)  D86.0 ECHOCARDIOGRAM COMPLETE   Appears quiescent Continue to monitor expectantly    2. Shortness of breath  R06.02 Nitric oxide    ECHOCARDIOGRAM COMPLETE   Stable No worsening Perhaps mild improvement Will check echocardiogram Query cardiac source    3. Hoarseness of voice  R49.0 Ambulatory referral to ENT   Referral to ENT Notes hoarseness towards the end of the day Weakness of the voice    4. Reactive airways dysfunction syndrome (HCC)  J68.3    Albuterol as  needed Discontinue Wixela, not effective    5. Renal lesion  N28.9    Hypodense lesion on the right kidney Cannot be characterized Recommend at least 1 time follow-up CT abdomen and pelvis Defer to primary care      Orders Placed This Encounter  Procedures   Ambulatory referral to ENT    Referral Priority:   Routine    Referral Type:   Consultation    Referral Reason:   Specialty Services Required    Referred to Provider:   Bud Face, MD    Requested Specialty:   Otolaryngology    Number of Visits Requested:   1   Nitric oxide   ECHOCARDIOGRAM COMPLETE    Standing Status:   Future    Standing Expiration Date:   01/04/2024    Order Specific Question:   Where should this test be performed    Answer:   [facility]    Order Specific Question:   Perflutren DEFINITY (image enhancing agent) should be administered unless hypersensitivity or allergy exist    Answer:   Administer Perflutren    Order Specific Question:   Is a special reader required? (athlete or structural heart)    Answer:   No    Order Specific Question:   Does this study need to be read by the Structural team/Level 3 readers?    Answer:   No    Order Specific Question:   Reason for exam-Echo    Answer:   Dyspnea  R06.00    Order Specific Question:   Other Comments    Answer:   Sarcoidosis    We will see the patient in follow-up in 4 months time he is to contact us or to that time should any new difficulties arise.    Gailen Shelter, MD Advanced Bronchoscopy PCCM [facility]    *This note was dictated using voice recognition software/Dragon.  Despite best efforts to proofread, errors can occur which can change the meaning. Any transcriptional errors that result from this process are unintentional and may not be fully corrected at the time of dictation.

## 2023-01-27 ENCOUNTER — Ambulatory Visit
Admission: RE | Admit: 2023-01-27 | Discharge: 2023-01-27 | Disposition: A | Discharge status: Home / Self Care | Payer: BC Managed Care – PPO | Source: Ambulatory Visit | Attending: Pulmonary Disease | Admitting: Pulmonary Disease

## 2023-01-27 DIAGNOSIS — R0602 Shortness of breath: Secondary | ICD-10-CM | POA: Diagnosis not present

## 2023-01-27 DIAGNOSIS — D860 Sarcoidosis of lung: Secondary | ICD-10-CM | POA: Insufficient documentation

## 2023-01-27 LAB — ECHOCARDIOGRAM COMPLETE
AR max vel: 2.04 cm2
AV Area VTI: 1.98 cm2
AV Area mean vel: 2.08 cm2
AV Mean grad: 5 mm[Hg]
AV Peak grad: 8.5 mm[Hg]
Ao pk vel: 1.46 m/s
Area-P 1/2: 2.91 cm2
MV VTI: 1.77 cm2
S' Lateral: 3.3 cm

## 2023-01-27 NOTE — Progress Notes (Signed)
*  PRELIMINARY RESULTS* Echocardiogram 2D Echocardiogram has been performed.  Cristela Blue 01/27/2023, 9:39 AM

## 2023-02-02 DIAGNOSIS — R490 Dysphonia: Secondary | ICD-10-CM | POA: Diagnosis not present

## 2023-02-04 ENCOUNTER — Ambulatory Visit (INDEPENDENT_AMBULATORY_CARE_PROVIDER_SITE_OTHER): Payer: BC Managed Care – PPO | Admitting: Family Medicine

## 2023-02-04 ENCOUNTER — Encounter: Payer: Self-pay | Admitting: Family Medicine

## 2023-02-04 VITALS — BP 108/68 | HR 77 | Temp 98.3°F | Resp 14 | Ht 71.0 in | Wt 191.6 lb

## 2023-02-04 DIAGNOSIS — E538 Deficiency of other specified B group vitamins: Secondary | ICD-10-CM | POA: Diagnosis not present

## 2023-02-04 DIAGNOSIS — Z0001 Encounter for general adult medical examination with abnormal findings: Secondary | ICD-10-CM | POA: Diagnosis not present

## 2023-02-04 DIAGNOSIS — E785 Hyperlipidemia, unspecified: Secondary | ICD-10-CM | POA: Diagnosis not present

## 2023-02-04 DIAGNOSIS — Z0000 Encounter for general adult medical examination without abnormal findings: Secondary | ICD-10-CM

## 2023-02-04 DIAGNOSIS — Z23 Encounter for immunization: Secondary | ICD-10-CM | POA: Diagnosis not present

## 2023-03-07 NOTE — Progress Notes (Signed)
Name: Jorge Palmer   MRN: [id number]    DOB: [birth year]   Date:03/15/2023       Progress Note  Subjective  Chief Complaint  Chief Complaint  Patient presents with   Medical Management of Chronic Issues   Results    Discuss albs    HPI  Discussed the use of AI scribe software for clinical note transcription with the patient, who gave verbal consent to proceed.  History of Present Illness   The patient, with a history of dyslipidemia and pulmonary sarcoidosis, presents with concerns about elevated cholesterol levels. He reports a significant change in diet over the past year, transitioning from a low-carb, low-sugar diet to a less restrictive one. He consumes eggs frequently, often with cheese, and is considering dietary modifications to manage his cholesterol levels.  The patient's recent labs revealed an increase in bad cholesterol (LDL) from 119 to 164, which is concerning despite a low ASCVD risk of 3.9% over the next ten years. He has a family history of high cholesterol and heart disease, with his mother and grandfather both on cholesterol medication. His grandfather had a heart attack in his sixties but lived until 62. The patient is physically active but acknowledges the need for more regular exercise.  In addition to dyslipidemia, the patient has been managing pulmonary sarcoidosis, which is currently in a monitoring state. He experienced complications following a recent bout of COVID-19, including hoarseness and shortness of breath. However, recent tests, including an echocardiogram, have returned normal results. The patient reports occasional shortness of breath, describing it as a 'catching' sensation when taking deep breaths, but denies any significant distress or impact on daily activities.  The patient also reports a history of irritable bowel syndrome (IBS), predominantly presenting with diarrhea and abdominal cramping. He manages his symptoms with Imodium, taken as needed,  approximately once a week. The patient notes that his IBS symptoms are not currently impacting his quality of life   Lastly, the patient has a small renal cortical lesion, which was identified on a recent CT scan. The lesion is currently too small to characterize, and no immediate follow-up imaging has been recommended. However, the patient and his healthcare provider have agreed to monitor this finding closely.     Results LABS LDL: 164 HDL: 49 Total Cholesterol: 244  RADIOLOGY Chest CT: Chronic pulmonary findings, calcified lymphadenopathy, aortic atherosclerosis (11/2022) Renal Ultrasound: Subcentimeter hypodense posterior upper right renal cortical lesion (11/2022)  DIAGNOSTIC Echocardiogram: Normal, EF 65% (01/2023)    The 10-year ASCVD risk score (Arnett DK, et al., 2019) is: 3.9%   Values used to calculate the score:     Age: 52 years     Sex: Male     Is Non-Hispanic African American: No     Diabetic: No     Tobacco smoker: No     Systolic Blood Pressure: 116 mmHg     Is BP treated: No     HDL Cholesterol: 49 mg/dL     Total Cholesterol: 244 mg/dL  Patient Active Problem List   Diagnosis Date Noted   Primary osteoarthritis of right knee 03/15/2022   It band syndrome, right 02/01/2022   Migraine headache with aura 01/26/2021   Perennial allergic rhinitis with seasonal variation 06/21/2017   Irritable bowel syndrome (IBS) 06/21/2017   Lumbar spondylosis with myelopathy 01/16/2015   Pulmonary sarcoidosis (HCC) 12/19/2013    Past Surgical History:  Procedure Laterality Date   COLONOSCOPY WITH PROPOFOL N/A 03/10/2021   Procedure: COLONOSCOPY  WITH PROPOFOL;  Surgeon: Wyline Mood, MD;  [facility] ENDOSCOPY;  Service: Gastroenterology;  Laterality: N/A;   microlaminectomy L4-5  01/2016   VIDEO BRONCHOSCOPY WITH ENDOBRONCHIAL ULTRASOUND N/A 09/2012   Station 7 LN Bx - [facility]    Family History  Problem Relation Age of Onset   Cancer Maternal Grandfather        lung    Heart disease Maternal Grandfather    Cancer Maternal Grandmother        ovarian   Rheum arthritis Maternal Grandmother    Cancer Father        melanoma   Migraines Sister    Parkinson's disease Paternal Grandmother    Liver disease Paternal Grandfather     Social History   Tobacco Use   Smoking status: Former    Current packs/day: 0.00    Average packs/day: 0.5 packs/day for 6.7 years (3.4 ttl pk-yrs)    Types: Cigarettes    Start date: 04/05/1997    Quit date: 12/20/2003    Years since quitting: 19.2   Smokeless tobacco: Never  Substance Use Topics   Alcohol use: No    Comment: since 2017 never a heavy drinker     Current Outpatient Medications:    aspirin-acetaminophen-caffeine (EXCEDRIN MIGRAINE) 250-250-65 MG tablet, Take 1 tablet by mouth every 6 (six) hours as needed., Disp: , Rfl:    B-COMPLEX-C PO, Take 1 capsule by mouth daily., Disp: , Rfl:    ibuprofen (ADVIL) 200 MG tablet, Take by mouth., Disp: , Rfl:    ketoconazole (NIZORAL) 2 % shampoo, Apply topically daily. (Patient not taking: Reported on 03/15/2023), Disp: , Rfl:   Allergies  Allergen Reactions   Sulfa Antibiotics     Rash-as an infant    I personally reviewed active problem list, medication list, allergies, family history, social history with the patient/caregiver today.   ROS  Ten systems reviewed and is negative except as mentioned in HPI    Objective  Vitals:   03/15/23 0917  BP: 116/74  Pulse: 74  Resp: 16  Temp: 98 F (36.7 C)  TempSrc: Oral  SpO2: 98%  Weight: 195 lb 1.6 oz (88.5 kg)  Height: 5\' 11"  (1.803 m)    Body mass index is 27.21 kg/m.  Physical Exam  Constitutional: Patient appears well-developed and well-nourished.  No distress.  HEENT: head atraumatic, normocephalic, pupils equal and reactive to light, neck supple Cardiovascular: Normal rate, regular rhythm and normal heart sounds.  No murmur heard. No BLE edema. Pulmonary/Chest: Effort normal and breath  sounds normal. No respiratory distress. Abdominal: Soft.  There is no tenderness. Psychiatric: Patient has a normal mood and affect. behavior is normal. Judgment and thought content normal.   Recent Results (from the past 2160 hour(s))  Nitric oxide     Status: None   Collection Time: 01/04/23  8:46 AM  Result Value Ref Range   Nitric Oxide 26   ECHOCARDIOGRAM COMPLETE     Status: None   Collection Time: 01/27/23  9:39 AM  Result Value Ref Range   Ao pk vel 1.46 m/s   AV Area VTI 1.98 cm2   AR max vel 2.04 cm2   AV Mean grad 5.0 mmHg   AV Peak grad 8.5 mmHg   S' Lateral 3.30 cm   AV Area mean vel 2.08 cm2   Area-P 1/2 2.91 cm2   MV VTI 1.77 cm2   Est EF 60 - 65%   Lipid panel     Status:  Abnormal   Collection Time: 03/10/23  9:23 AM  Result Value Ref Range   Cholesterol 244 (H) <200 mg/dL   HDL 49 > OR = 40 mg/dL   Triglycerides 010 (H) <150 mg/dL   LDL Cholesterol (Calc) 164 (H) mg/dL (calc)    Comment: Reference range: <100 . Desirable range <100 mg/dL for primary prevention;   <70 mg/dL for patients with CHD or diabetic patients  with > or = 2 CHD risk factors. Marland Kitchen LDL-C is now calculated using the Martin-Hopkins  calculation, which is a validated novel method providing  better accuracy than the Friedewald equation in the  estimation of LDL-C.  Horald Pollen et al. Lenox Ahr. 2725;366(44): 2061-2068  (http://education.[employer].com/faq/FAQ164)    Total CHOL/HDL Ratio 5.0 (H) <5.0 (calc)   Non-HDL Cholesterol (Calc) 195 (H) <130 mg/dL (calc)    Comment: For patients with diabetes plus 1 major ASCVD risk  factor, treating to a non-HDL-C goal of <100 mg/dL  (LDL-C of <03 mg/dL) is considered a therapeutic  option.   COMPLETE METABOLIC PANEL WITH GFR     Status: None   Collection Time: 03/10/23  9:23 AM  Result Value Ref Range   Glucose, Bld 94 65 - 99 mg/dL    Comment: .            Fasting reference interval .    BUN 21 7 - 25 mg/dL   Creat 4.74 2.59 - 5.63 mg/dL    eGFR 875 > OR = 60 IE/PPI/9.51O8   BUN/Creatinine Ratio SEE NOTE: 6 - 22 (calc)    Comment:    Not Reported: BUN and Creatinine are within    reference range. .    Sodium 140 135 - 146 mmol/L   Potassium 4.4 3.5 - 5.3 mmol/L   Chloride 101 98 - 110 mmol/L   CO2 31 20 - 32 mmol/L   Calcium 9.2 8.6 - 10.3 mg/dL   Total Protein 7.1 6.1 - 8.1 g/dL   Albumin 4.5 3.6 - 5.1 g/dL   Globulin 2.6 1.9 - 3.7 g/dL (calc)   AG Ratio 1.7 1.0 - 2.5 (calc)   Total Bilirubin 0.8 0.2 - 1.2 mg/dL   Alkaline phosphatase (APISO) 51 35 - 144 U/L   AST 21 10 - 35 U/L   ALT 25 9 - 46 U/L  CBC with Differential/Platelet     Status: None   Collection Time: 03/10/23  9:23 AM  Result Value Ref Range   WBC 7.0 3.8 - 10.8 Thousand/uL   RBC 5.41 4.20 - 5.80 Million/uL   Hemoglobin 16.2 13.2 - 17.1 g/dL   HCT 41.6 60.6 - 30.1 %   MCV 91.7 80.0 - 100.0 fL   MCH 29.9 27.0 - 33.0 pg   MCHC 32.7 32.0 - 36.0 g/dL    Comment: For adults, a slight decrease in the calculated MCHC value (in the range of 30 to 32 g/dL) is most likely not clinically significant; however, it should be interpreted with caution in correlation with other red cell parameters and the patient's clinical condition.    RDW 12.3 11.0 - 15.0 %   Platelets 260 140 - 400 Thousand/uL   MPV 11.1 7.5 - 12.5 fL   Neutro Abs 4,830 1,500 - 7,800 cells/uL   Absolute Lymphocytes 1,274 850 - 3,900 cells/uL   Absolute Monocytes 441 200 - 950 cells/uL   Eosinophils Absolute 392 15 - 500 cells/uL   Basophils Absolute 63 0 - 200 cells/uL   Neutrophils Relative % 69 %  Total Lymphocyte 18.2 %   Monocytes Relative 6.3 %   Eosinophils Relative 5.6 %   Basophils Relative 0.9 %  B12 and Folate Panel     Status: None   Collection Time: 03/10/23  9:23 AM  Result Value Ref Range   Vitamin B-12 734 200 - 1,100 pg/mL   Folate 16.2 ng/mL    Comment:                            Reference Range                            Low:           <3.4                             Borderline:    3.4-5.4                            Normal:        >5.4 .      PHQ2/9:    03/15/2023    9:16 AM 02/04/2023    8:13 AM 02/01/2022   11:09 AM 02/01/2022   11:05 AM 01/26/2021   11:17 AM  Depression screen PHQ 2/9  Decreased Interest 0 0 0 0 0  Down, Depressed, Hopeless 0 0 0 0 0  PHQ - 2 Score 0 0 0 0 0  Altered sleeping 0 0 0  0  Tired, decreased energy 0 0 0  0  Change in appetite 0 0 0  0  Feeling bad or failure about yourself  0 0 0  0  Trouble concentrating 0 0 0  0  Moving slowly or fidgety/restless 0 0 0  0  Suicidal thoughts 0 0 0  0  PHQ-9 Score 0 0 0  0  Difficult doing work/chores Not difficult at all        phq 9 is negative   Fall Risk:    03/15/2023    9:16 AM 02/04/2023    8:13 AM 02/01/2022   11:08 AM 01/26/2021   11:17 AM 06/21/2017   12:05 PM  Fall Risk   Falls in the past year? 0 0 0 0 No  Number falls in past yr: 0   0   Injury with Fall? 0   0   Risk for fall due to : No Fall Risks No Fall Risks No Fall Risks No Fall Risks   Follow up Falls prevention discussed;Education provided;Falls evaluation completed Falls prevention discussed Falls prevention discussed;Education provided;Falls evaluation completed Falls prevention discussed       Functional Status Survey: Is the patient deaf or have difficulty hearing?: Yes Does the patient have difficulty seeing, even when wearing glasses/contacts?: No Does the patient have difficulty concentrating, remembering, or making decisions?: No Does the patient have difficulty walking or climbing stairs?: No Does the patient have difficulty dressing or bathing?: No Does the patient have difficulty doing errands alone such as visiting a doctor's office or shopping?: No    Assessment & Plan  Assessment and Plan    Dyslipidemia LDL increased from 119 to 366. Discussed the importance of diet and exercise in managing cholesterol levels. Family history of heart disease and aortic  atherosclerosis noted on previous CT scan. -Encouraged dietary modifications and increased physical activity. -  Plan to recheck lipid panel and high sensitive CRP in 4 months. -Consider coronary calcium score (CTA) in 4 months.  Renal lesion Small hypodense posterior upper right renal cortical lesion noted on previous imaging. -Plan to follow up with renal ultrasound in 4 months.  Pulmonary Sarcoidosis Stable, under routine monitoring with pulmonologist. Recent echocardiogram normal. -Continue follow-up with pulmonologist.  Irritable Bowel Syndrome (Diarrhea-predominant) Controlled with periodic use of Imodium. -Discussed potential use of Xifaxan if symptoms worsen or affect quality of life.  General Health Maintenance -Plan to administer second dose of Shingles vaccine in 4 months. -Order EKG in 4 months.

## 2023-03-08 ENCOUNTER — Other Ambulatory Visit: Payer: Self-pay

## 2023-03-08 NOTE — Telephone Encounter (Signed)
Lmtcb. Need to confirm that patient is using Wixela again.

## 2023-03-08 NOTE — Telephone Encounter (Signed)
Patient reports he is not using Wixela. Medication/refill refused.

## 2023-03-10 DIAGNOSIS — Z0000 Encounter for general adult medical examination without abnormal findings: Secondary | ICD-10-CM | POA: Diagnosis not present

## 2023-03-10 DIAGNOSIS — E538 Deficiency of other specified B group vitamins: Secondary | ICD-10-CM | POA: Diagnosis not present

## 2023-03-10 DIAGNOSIS — E785 Hyperlipidemia, unspecified: Secondary | ICD-10-CM | POA: Diagnosis not present

## 2023-03-11 LAB — LIPID PANEL
Cholesterol: 244 mg/dL — ABNORMAL HIGH (ref <200)
HDL: 49 mg/dL (ref >=40)
LDL Cholesterol (Calc): 164 mg/dL — ABNORMAL HIGH
Non-HDL Cholesterol (Calc): 195 mg/dL — ABNORMAL HIGH (ref <130)
Total CHOL/HDL Ratio: 5 (calc) — ABNORMAL HIGH (ref <5.0)
Triglycerides: 160 mg/dL — ABNORMAL HIGH (ref <150)

## 2023-03-11 LAB — CBC WITH DIFFERENTIAL/PLATELET
Absolute Lymphocytes: 1274 {cells}/uL (ref 850–3900)
Absolute Monocytes: 441 {cells}/uL (ref 200–950)
Basophils Absolute: 63 {cells}/uL (ref 0–200)
Basophils Relative: 0.9 %
Eosinophils Absolute: 392 {cells}/uL (ref 15–500)
Eosinophils Relative: 5.6 %
HCT: 49.6 % (ref 38.5–50.0)
Hemoglobin: 16.2 g/dL (ref 13.2–17.1)
MCH: 29.9 pg (ref 27.0–33.0)
MCHC: 32.7 g/dL (ref 32.0–36.0)
MCV: 91.7 fL (ref 80.0–100.0)
MPV: 11.1 fL (ref 7.5–12.5)
Monocytes Relative: 6.3 %
Neutro Abs: 4830 {cells}/uL (ref 1500–7800)
Neutrophils Relative %: 69 %
Platelets: 260 10*3/uL (ref 140–400)
RBC: 5.41 10*6/uL (ref 4.20–5.80)
RDW: 12.3 % (ref 11.0–15.0)
Total Lymphocyte: 18.2 %
WBC: 7 10*3/uL (ref 3.8–10.8)

## 2023-03-11 LAB — COMPLETE METABOLIC PANEL WITH GFR
AG Ratio: 1.7 (calc) (ref 1.0–2.5)
ALT: 25 U/L (ref 9–46)
AST: 21 U/L (ref 10–35)
Albumin: 4.5 g/dL (ref 3.6–5.1)
Alkaline phosphatase (APISO): 51 U/L (ref 35–144)
BUN: 21 mg/dL (ref 7–25)
CO2: 31 mmol/L (ref 20–32)
Calcium: 9.2 mg/dL (ref 8.6–10.3)
Chloride: 101 mmol/L (ref 98–110)
Creat: 0.93 mg/dL (ref 0.70–1.30)
Globulin: 2.6 g/dL (ref 1.9–3.7)
Glucose, Bld: 94 mg/dL (ref 65–99)
Potassium: 4.4 mmol/L (ref 3.5–5.3)
Sodium: 140 mmol/L (ref 135–146)
Total Bilirubin: 0.8 mg/dL (ref 0.2–1.2)
Total Protein: 7.1 g/dL (ref 6.1–8.1)
eGFR: 100 mL/min/{1.73_m2} (ref >=60)

## 2023-03-11 LAB — B12 AND FOLATE PANEL
Folate: 16.2 ng/mL
Vitamin B-12: 734 pg/mL (ref 200–1100)

## 2023-03-15 ENCOUNTER — Encounter: Payer: Self-pay | Admitting: Family Medicine

## 2023-03-15 ENCOUNTER — Ambulatory Visit: Payer: BC Managed Care – PPO | Admitting: Family Medicine

## 2023-03-15 VITALS — BP 116/74 | HR 74 | Temp 98.0°F | Resp 16 | Ht 71.0 in | Wt 195.1 lb

## 2023-03-15 DIAGNOSIS — N289 Disorder of kidney and ureter, unspecified: Secondary | ICD-10-CM

## 2023-03-15 DIAGNOSIS — E785 Hyperlipidemia, unspecified: Secondary | ICD-10-CM | POA: Diagnosis not present

## 2023-03-15 DIAGNOSIS — E538 Deficiency of other specified B group vitamins: Secondary | ICD-10-CM | POA: Diagnosis not present

## 2023-03-15 DIAGNOSIS — Z8249 Family history of ischemic heart disease and other diseases of the circulatory system: Secondary | ICD-10-CM

## 2023-03-15 DIAGNOSIS — D860 Sarcoidosis of lung: Secondary | ICD-10-CM | POA: Diagnosis not present

## 2023-03-15 DIAGNOSIS — Z23 Encounter for immunization: Secondary | ICD-10-CM | POA: Diagnosis not present

## 2023-03-15 DIAGNOSIS — I700 Atherosclerosis of aorta: Secondary | ICD-10-CM

## 2023-07-14 ENCOUNTER — Other Ambulatory Visit: Payer: Self-pay | Admitting: Family Medicine

## 2023-07-14 ENCOUNTER — Ambulatory Visit: Payer: Self-pay | Admitting: Family Medicine

## 2023-07-14 ENCOUNTER — Encounter: Payer: Self-pay | Admitting: Family Medicine

## 2023-07-14 VITALS — BP 116/72 | HR 58 | Resp 16 | Ht 71.0 in | Wt 186.9 lb

## 2023-07-14 DIAGNOSIS — E785 Hyperlipidemia, unspecified: Secondary | ICD-10-CM | POA: Diagnosis not present

## 2023-07-14 DIAGNOSIS — G43109 Migraine with aura, not intractable, without status migrainosus: Secondary | ICD-10-CM

## 2023-07-14 DIAGNOSIS — Z23 Encounter for immunization: Secondary | ICD-10-CM | POA: Diagnosis not present

## 2023-07-14 DIAGNOSIS — E538 Deficiency of other specified B group vitamins: Secondary | ICD-10-CM

## 2023-07-14 DIAGNOSIS — I700 Atherosclerosis of aorta: Secondary | ICD-10-CM | POA: Diagnosis not present

## 2023-07-14 DIAGNOSIS — M7631 Iliotibial band syndrome, right leg: Secondary | ICD-10-CM

## 2023-07-14 DIAGNOSIS — G3184 Mild cognitive impairment, so stated: Secondary | ICD-10-CM

## 2023-07-14 DIAGNOSIS — M1711 Unilateral primary osteoarthritis, right knee: Secondary | ICD-10-CM

## 2023-07-14 DIAGNOSIS — D860 Sarcoidosis of lung: Secondary | ICD-10-CM

## 2023-07-14 MED ORDER — ZAVZPRET 10 MG/ACT NA SOLN: 1.0000 | Freq: Every day | NASAL | 1 refills | Status: DC | PRN

## 2023-07-14 NOTE — Progress Notes (Signed)
 Name: Jorge Palmer   MRN: [id number]    DOB: May 11, 1970   Date:07/14/2023       Progress Note  Subjective  Chief Complaint  Chief Complaint  Patient presents with   Medical Management of Chronic Issues   Discussed the use of AI scribe software for clinical note transcription with the patient, who gave verbal consent to proceed.  History of Present Illness Jorge Palmer "[name]" is a 53 year old male who presents for a follow-up visit and second shingles vaccination.  He has been focusing on improving his cholesterol levels and weight management. Since his last visit, he has lost approximately 8 pounds, reducing his weight from 195 pounds to 186.9 pounds. He has made dietary changes, focusing on reducing processed foods and sugars, and typically consumes meals like grilled [place] patties with green beans and baked chicken or fish for dinner.  He has a history of hyperlipidemia with previously high triglycerides and LDL cholesterol levels. His triglycerides were noted to be 283 mg/dL, and LDL was 829 mg/dL, which is above the desired level of 150 mg/dL. He is not currently on any cholesterol medications. He is considering further testing to assess cardiovascular risk, including a high sensitivity CRP test.  He has a history of atherosclerosis of the aorta, which is a known risk factor for cardiovascular events. He is considering genetic screening offered by [facility] to assess further risk factors.  He reports a history of pulmonary sarcoidosis, which has been associated with a persistent cough that he attributes partly to seasonal allergies. He has not seen his specialist, Dr. Jayme Cloud, this year but had frequent visits last year. He experiences shortness of breath and has been diagnosed with active airway disease, which sometimes causes wheezing. He does not regularly take allergy medications but is open to using them as needed during allergy season.  He has a history of migraines  since childhood, currently managed with Excedrin Migraine. He experiences migraines approximately three to four times a year, characterized by aura, visual disturbances, and difficulty speaking. In the past, he used Tylenol with codeine for severe episodes. He can often predict the onset of a migraine due to onset of aura such as scotomas  He has experienced some cognitive difficulties, particularly with word recall, which prompted a visit to a neurologist, Dr. Sherryll Burger. The neurologist attributed these issues to natural aging and did not express significant concern. He does not consume alcohol and has been focusing on reducing processed food intake to support cognitive health.    Patient Active Problem List   Diagnosis Date Noted   Primary osteoarthritis of right knee 03/15/2022   Migraine headache with aura 01/26/2021   Perennial allergic rhinitis with seasonal variation 06/21/2017   Irritable bowel syndrome (IBS) 06/21/2017   Lumbar spondylosis with myelopathy 01/16/2015   Pulmonary sarcoidosis (HCC) 12/19/2013    Past Surgical History:  Procedure Laterality Date   COLONOSCOPY WITH PROPOFOL N/A 03/10/2021   Procedure: COLONOSCOPY WITH PROPOFOL;  Surgeon: Wyline Mood, MD;  Location: [facility] ENDOSCOPY;  Service: Gastroenterology;  Laterality: N/A;   microlaminectomy L4-5  01/2016   VIDEO BRONCHOSCOPY WITH ENDOBRONCHIAL ULTRASOUND N/A 09/2012   [address] [facility] - [facility]    Family History  Problem Relation Age of Onset   Cancer Maternal Grandfather        lung   Heart disease Maternal Grandfather    Cancer Maternal Grandmother        ovarian   Rheum arthritis Maternal Grandmother  Cancer Father        melanoma   Migraines Sister    Parkinson's disease Paternal Grandmother    Liver disease Paternal Grandfather     Social History   Tobacco Use   Smoking status: Former    Current packs/day: 0.00    Average packs/day: 0.5 packs/day for 6.7 years (3.4 ttl pk-yrs)    Types: Cigarettes     Start date: 04/05/1997    Quit date: 12/20/2003    Years since quitting: 19.5   Smokeless tobacco: Never  Substance Use Topics   Alcohol use: No    Comment: since 2017 never a heavy drinker     Current Outpatient Medications:    aspirin-acetaminophen-caffeine (EXCEDRIN MIGRAINE) 250-250-65 MG tablet, Take 1 tablet by mouth every 6 (six) hours as needed., Disp: , Rfl:    B-COMPLEX-C PO, Take 1 capsule by mouth daily., Disp: , Rfl:    ibuprofen (ADVIL) 200 MG tablet, Take by mouth., Disp: , Rfl:    ketoconazole (NIZORAL) 2 % shampoo, Apply topically daily. (Patient not taking: Reported on 07/14/2023), Disp: , Rfl:   Allergies  Allergen Reactions   Sulfa Antibiotics     Rash-as an infant    I personally reviewed active problem list, medication list, allergies, family history with the patient/caregiver today.   ROS  Ten systems reviewed and is negative except as mentioned in HPI    Objective Physical Exam  Constitutional: Patient appears well-developed and well-nourished.  No distress.  HEENT: head atraumatic, normocephalic, pupils equal and reactive to light, neck supple Cardiovascular: Normal rate, regular rhythm and normal heart sounds.  No murmur heard. No BLE edema. Pulmonary/Chest: Effort normal and breath sounds normal. No respiratory distress. Abdominal: Soft.  There is no tenderness. Psychiatric: Patient has a normal mood and affect. behavior is normal. Judgment and thought content normal.   Vitals:   07/14/23 1017  BP: 116/72  Pulse: (!) 58  Resp: 16  SpO2: 99%  Weight: 186 lb 14.4 oz (84.8 kg)  Height: 5\' 11"  (1.803 m)    Body mass index is 26.07 kg/m.    PHQ2/9:    07/14/2023   10:10 AM 03/15/2023    9:16 AM 02/04/2023    8:13 AM 02/01/2022   11:09 AM 02/01/2022   11:05 AM  Depression screen PHQ 2/9  Decreased Interest 0 0 0 0 0  Down, Depressed, Hopeless 0 0 0 0 0  PHQ - 2 Score 0 0 0 0 0  Altered sleeping 0 0 0 0   Tired, decreased energy 0 0  0 0   Change in appetite 0 0 0 0   Feeling bad or failure about yourself  0 0 0 0   Trouble concentrating 0 0 0 0   Moving slowly or fidgety/restless 0 0 0 0   Suicidal thoughts 0 0 0 0   PHQ-9 Score 0 0 0 0   Difficult doing work/chores Not difficult at all Not difficult at all       phq 9 is negative  Fall Risk:    03/15/2023    9:16 AM 02/04/2023    8:13 AM 02/01/2022   11:08 AM 01/26/2021   11:17 AM 06/21/2017   12:05 PM  Fall Risk   Falls in the past year? 0 0 0 0 No  Number falls in past yr: 0   0   Injury with Fall? 0   0   Risk for fall due to : No Fall Risks  No Fall Risks No Fall Risks No Fall Risks   Follow up Falls prevention discussed;Education provided;Falls evaluation completed Falls prevention discussed Falls prevention discussed;Education provided;Falls evaluation completed Falls prevention discussed      Assessment & Plan Pulmonary sarcoidosis Persistent cough possibly exacerbated by seasonal allergies. - Consider allergy management with Claritin as needed.  Atherosclerosis of the aorta Risk factor for cardiovascular events. Discussed early intervention and monitoring. Encouraged participation in genetic screening study. - Order high sensitivity CRP test. - Encourage participation in genetic screening study for cardiovascular risk factors.  Hyperlipidemia Previously elevated cholesterol and triglycerides. Dietary changes and weight loss may improve lipid levels. - Order lipid panel to assess effectiveness of lifestyle modifications.  Migraine Migraines with aura and difficulty speaking. Discussed newer medications like Nurtec  or nasal form as Vazpret for rapid onset. - Prescribe Vazpret  with a voucher for free initial supply. - Educate on the use of nasal spray for rapid onset symptoms.  Cognitive dysfunction  Emphasized avoiding ultra processed foods and alcohol ( he has not been drinking for the past 10 years) . - Encourage cognitive exercises and  healthy lifestyle choices.  General Health Maintenance Due for second shingles vaccine. Discussed importance of health screenings and vaccinations. - Administer second shingles vaccine. - Perform blood work including lipid panel and high sensitivity CRP.  Follow-up Scheduled for follow-up in six months. Discussed timing of shingles vaccine with vacation plans. - Schedule follow-up appointment in six months.

## 2023-07-15 ENCOUNTER — Other Ambulatory Visit: Payer: Self-pay | Admitting: Family Medicine

## 2023-07-15 DIAGNOSIS — E785 Hyperlipidemia, unspecified: Secondary | ICD-10-CM | POA: Diagnosis not present

## 2023-07-15 DIAGNOSIS — I700 Atherosclerosis of aorta: Secondary | ICD-10-CM | POA: Diagnosis not present

## 2023-07-15 MED ORDER — NURTEC 75 MG PO TBDP: 1.0000 | ORAL_TABLET | Freq: Every day | ORAL | 0 refills | Status: DC | PRN

## 2023-07-16 LAB — LIPID PANEL
Cholesterol: 254 mg/dL — ABNORMAL HIGH (ref <200)
HDL: 46 mg/dL (ref >=40)
LDL Cholesterol (Calc): 181 mg/dL — ABNORMAL HIGH
Non-HDL Cholesterol (Calc): 208 mg/dL — ABNORMAL HIGH (ref <130)
Total CHOL/HDL Ratio: 5.5 (calc) — ABNORMAL HIGH (ref <5.0)
Triglycerides: 132 mg/dL (ref <150)

## 2023-07-16 LAB — HIGH SENSITIVITY CRP: hs-CRP: 11.3 mg/L — ABNORMAL HIGH

## 2023-07-25 ENCOUNTER — Encounter: Payer: Self-pay | Admitting: Family Medicine

## 2023-10-04 DIAGNOSIS — G2581 Restless legs syndrome: Secondary | ICD-10-CM | POA: Insufficient documentation

## 2023-10-21 DIAGNOSIS — D2262 Melanocytic nevi of left upper limb, including shoulder: Secondary | ICD-10-CM | POA: Diagnosis not present

## 2023-10-21 DIAGNOSIS — L538 Other specified erythematous conditions: Secondary | ICD-10-CM | POA: Diagnosis not present

## 2023-10-21 DIAGNOSIS — D225 Melanocytic nevi of trunk: Secondary | ICD-10-CM | POA: Diagnosis not present

## 2023-10-21 DIAGNOSIS — R208 Other disturbances of skin sensation: Secondary | ICD-10-CM | POA: Diagnosis not present

## 2023-10-21 DIAGNOSIS — D2261 Melanocytic nevi of right upper limb, including shoulder: Secondary | ICD-10-CM | POA: Diagnosis not present

## 2023-10-21 DIAGNOSIS — D2272 Melanocytic nevi of left lower limb, including hip: Secondary | ICD-10-CM | POA: Diagnosis not present

## 2023-10-21 DIAGNOSIS — D485 Neoplasm of uncertain behavior of skin: Secondary | ICD-10-CM | POA: Diagnosis not present

## 2023-10-21 DIAGNOSIS — D235 Other benign neoplasm of skin of trunk: Secondary | ICD-10-CM | POA: Diagnosis not present

## 2023-10-21 DIAGNOSIS — L281 Prurigo nodularis: Secondary | ICD-10-CM | POA: Diagnosis not present

## 2023-12-19 DIAGNOSIS — D225 Melanocytic nevi of trunk: Secondary | ICD-10-CM | POA: Diagnosis not present

## 2023-12-19 DIAGNOSIS — L905 Scar conditions and fibrosis of skin: Secondary | ICD-10-CM | POA: Diagnosis not present

## 2024-01-10 NOTE — Progress Notes (Signed)
    Assessment & Plan:  1. Sarcoidosis (Primary) - Pulmonary Function Test [facility] Only; Future - Angiotensin converting enzyme; Future - Histoplasma Gal'mannan Ag Ser; Future  2. Lung nodules   Patient Instructions  We are going to get breathing tests  I am getting some blood samples to check for activity of sarcoid and for potential fungus particularly since you are bronchoscopy in the past showed some fungus.  We will see you in follow-up in 3 months time call sooner should any new difficulties arise  Please note: late entry documentation due to logistical difficulties during COVID-19 pandemic. This note is filed for information purposes only, and is not intended to be used for billing, nor does it represent the full scope/nature of the visit in question. Please see any associated scanned media linked to date of encounter for additional pertinent information.  Subjective:    HPI: Jorge Palmer is a 53 y.o. male presenting to the pulmonology clinic on 09/19/2019 with report of: pulmonary consult (per Delon Hope, NP- hx of sarcoidosis. c/o occ mild sob with exertion. )     Outpatient Encounter Medications as of 09/19/2019  Medication Sig   [DISCONTINUED] cetirizine (ZYRTEC) 10 MG tablet Take 10 mg by mouth daily.   [DISCONTINUED] traMADol  (ULTRAM ) 50 MG tablet Take 1 tablet (50 mg total) by mouth every 6 (six) hours as needed.   [DISCONTINUED] gabapentin  (NEURONTIN ) 600 MG tablet Take 600 mg by mouth 3 (three) times daily.   [DISCONTINUED] omeprazole (PRILOSEC) 20 MG capsule Take 20 mg by mouth daily.   [DISCONTINUED] ondansetron  (ZOFRAN -ODT) 4 MG disintegrating tablet Take 1 tablet (4 mg total) by mouth every 8 (eight) hours as needed for nausea or vomiting.   No facility-administered encounter medications on file as of 09/19/2019.      Objective:   Vitals:   09/19/19 0834  BP: 130/72  Pulse: 60  Temp: 98.3 F (36.8 C)  Height: 5' 11 (1.803 m)  Weight: 186 lb  9.6 oz (84.6 kg)  SpO2: 96%  TempSrc: Temporal  BMI (Calculated): 26.04     Physical exam documentation is limited by delayed entry of information.

## 2024-01-12 ENCOUNTER — Ambulatory Visit: Admitting: Family Medicine

## 2024-01-13 ENCOUNTER — Ambulatory Visit: Admitting: Family Medicine

## 2024-01-13 ENCOUNTER — Encounter: Payer: Self-pay | Admitting: Family Medicine

## 2024-01-13 VITALS — BP 108/72 | HR 65 | Resp 16 | Ht 71.0 in | Wt 184.1 lb

## 2024-01-13 DIAGNOSIS — I700 Atherosclerosis of aorta: Secondary | ICD-10-CM

## 2024-01-13 DIAGNOSIS — M1711 Unilateral primary osteoarthritis, right knee: Secondary | ICD-10-CM

## 2024-01-13 DIAGNOSIS — D860 Sarcoidosis of lung: Secondary | ICD-10-CM | POA: Diagnosis not present

## 2024-01-13 DIAGNOSIS — R7982 Elevated C-reactive protein (CRP): Secondary | ICD-10-CM

## 2024-01-13 DIAGNOSIS — G43109 Migraine with aura, not intractable, without status migrainosus: Secondary | ICD-10-CM | POA: Diagnosis not present

## 2024-01-13 DIAGNOSIS — E538 Deficiency of other specified B group vitamins: Secondary | ICD-10-CM | POA: Diagnosis not present

## 2024-01-13 DIAGNOSIS — Z23 Encounter for immunization: Secondary | ICD-10-CM | POA: Diagnosis not present

## 2024-01-13 DIAGNOSIS — G2581 Restless legs syndrome: Secondary | ICD-10-CM | POA: Diagnosis not present

## 2024-01-13 DIAGNOSIS — E785 Hyperlipidemia, unspecified: Secondary | ICD-10-CM

## 2024-01-13 DIAGNOSIS — Z79899 Other long term (current) drug therapy: Secondary | ICD-10-CM

## 2024-01-13 DIAGNOSIS — Z131 Encounter for screening for diabetes mellitus: Secondary | ICD-10-CM

## 2024-01-13 MED ORDER — SUMATRIPTAN SUCCINATE 100 MG PO TABS: 100.0000 mg | ORAL_TABLET | ORAL | 0 refills | Status: AC | PRN

## 2024-01-13 MED ORDER — NURTEC 75 MG PO TBDP: 1.0000 | ORAL_TABLET | Freq: Every day | ORAL | 0 refills | Status: AC | PRN

## 2024-01-13 NOTE — Progress Notes (Signed)
 Name: Jorge Palmer   MRN: [id number]    DOB: March 14, 1971   Date:01/13/2024       Progress Note  Subjective  Chief Complaint  Chief Complaint  Patient presents with   Medical Management of Chronic Issues   Discussed the use of AI scribe software for clinical note transcription with the patient, who gave verbal consent to proceed.  History of Present Illness Jorge Palmer is a 53 year old male with migraines who presents for follow-up and medication management.  He experiences migraines approximately once every two months, during which he uses Excedrin and rests in a dark room. While he can work on the day of a migraine, he is unable to do so for a few hours due to difficulty seeing the screen. He has had issues obtaining Nurtec due to insurance approval problems and has not tried Imitrex or other triptans before, but willing to try it and we will send rx to pharmacy today   He experiences discomfort and an urge to move his legs, particularly at night. His wife reports that he sometimes kicks as if running in his sleep. He sleeps with a pillow between his knees to alleviate aching legs, which sometimes prevents him from falling asleep. He notes stiffness in his left leg, particularly when trying to close it fully, but denies knee pain.  He has a history of sarcoidosis and is not currently on medication for it. He follows up with a specialist annually and has an upcoming appointment. No current symptoms such as shortness of breath or wheezing are present.  He reports lumbar spondylosis with periodic discomfort, especially after sitting for a while, which makes it difficult to straighten up immediately upon standing. He had surgery for myelopathy involving L4 and L5 in the past.  He has irritable bowel syndrome with symptoms primarily of diarrhea, managed with Imodium as needed. He experiences occasional stomach aches but no significant pain.    Patient Active Problem List    Diagnosis Date Noted   Restless leg 10/04/2023   Primary osteoarthritis of right knee 03/15/2022   Migraine headache with aura 01/26/2021   Perennial allergic rhinitis with seasonal variation 06/21/2017   Irritable bowel syndrome (IBS) 06/21/2017   Lumbar spondylosis with myelopathy 01/16/2015   Pulmonary sarcoidosis 12/19/2013    Past Surgical History:  Procedure Laterality Date   COLONOSCOPY WITH PROPOFOL  N/A 03/10/2021   Procedure: COLONOSCOPY WITH PROPOFOL ;  Surgeon: Therisa Bi, MD;  Location: [facility] ENDOSCOPY;  Service: Gastroenterology;  Laterality: N/A;   microlaminectomy L4-5  01/2016   SPINE SURGERY     In chart.  L4/L5 surgery.   VIDEO BRONCHOSCOPY WITH ENDOBRONCHIAL ULTRASOUND N/A 09/2012   [address] [facility] - [facility]    Family History  Problem Relation Age of Onset   Cancer Maternal Grandfather        lung   Heart disease Maternal Grandfather    Cancer Maternal Grandmother        ovarian   Rheum arthritis Maternal Grandmother    Cancer Father        melanoma   Hearing loss Father    Varicose Veins Mother    Migraines Sister    Parkinson's disease Paternal Grandmother    Liver disease Paternal Grandfather     Social History   Tobacco Use   Smoking status: Former    Current packs/day: 0.00    Average packs/day: 0.5 packs/day for 6.7 years (3.4 ttl pk-yrs)    Types: Cigarettes  Start date: 04/05/1997    Quit date: 12/20/2003    Years since quitting: 20.0   Smokeless tobacco: Never  Substance Use Topics   Alcohol use: No    Comment: since 2017 never a heavy drinker     Current Outpatient Medications:    aspirin-acetaminophen -caffeine (EXCEDRIN MIGRAINE) 250-250-65 MG tablet, Take 1 tablet by mouth every 6 (six) hours as needed., Disp: , Rfl:    B-COMPLEX-C PO, Take 1 capsule by mouth daily., Disp: , Rfl:    ibuprofen (ADVIL) 200 MG tablet, Take by mouth., Disp: , Rfl:    ketoconazole (NIZORAL) 2 % shampoo, Apply topically daily., Disp: , Rfl:     SUMAtriptan (IMITREX) 100 MG tablet, Take 1 tablet (100 mg total) by mouth every 2 (two) hours as needed for migraine. May repeat in 2 hours if headache persists or recurs., Disp: 10 tablet, Rfl: 0   Rimegepant Sulfate (NURTEC) 75 MG TBDP, Take 1 tablet (75 mg total) by mouth daily as needed., Disp: 16 tablet, Rfl: 0  Allergies  Allergen Reactions   Sulfa Antibiotics     Rash-as an infant    I personally reviewed active problem list, medication list, allergies with the patient/caregiver today.   ROS  Ten systems reviewed and is negative except as mentioned in HPI    Objective Physical Exam CONSTITUTIONAL: Patient appears well-developed and well-nourished. No distress. HEENT: Head atraumatic, normocephalic, neck supple. CARDIOVASCULAR: Normal rate, regular rhythm and normal heart sounds. No murmur heard. No BLE edema. PULMONARY: Effort normal and breath sounds normal. Lungs clear to auscultation bilaterally. No respiratory distress. ABDOMINAL: There is no tenderness or distention. MUSCULOSKELETAL: Normal gait. Decrease rom of left knee  PSYCHIATRIC: Patient has a normal mood and affect. Behavior is normal. Judgment and thought content normal.  Vitals:   01/13/24 0755  BP: 108/72  Pulse: 65  Resp: 16  SpO2: 100%  Weight: 184 lb 1.6 oz (83.5 kg)  Height: 5' 11 (1.803 m)    Body mass index is 25.68 kg/m.    PHQ2/9:    01/13/2024    7:52 AM 07/14/2023   10:10 AM 03/15/2023    9:16 AM 02/04/2023    8:13 AM 02/01/2022   11:09 AM  Depression screen PHQ 2/9  Decreased Interest 0 0 0 0 0  Down, Depressed, Hopeless 0 0 0 0 0  PHQ - 2 Score 0 0 0 0 0  Altered sleeping  0 0 0 0  Tired, decreased energy  0 0 0 0  Change in appetite  0 0 0 0  Feeling bad or failure about yourself   0 0 0 0  Trouble concentrating  0 0 0 0  Moving slowly or fidgety/restless  0 0 0 0  Suicidal thoughts  0 0 0 0  PHQ-9 Score  0 0 0 0  Difficult doing work/chores  Not difficult at all Not  difficult at all      phq 9 is negative  Fall Risk:    01/13/2024    7:51 AM 03/15/2023    9:16 AM 02/04/2023    8:13 AM 02/01/2022   11:08 AM 01/26/2021   11:17 AM  Fall Risk   Falls in the past year? 0 0 0 0 0  Number falls in past yr: 0 0   0  Injury with Fall? 0 0   0  Risk for fall due to : No Fall Risks No Fall Risks No Fall Risks No Fall Risks No Fall Risks  Follow up Falls evaluation completed Falls prevention discussed;Education provided;Falls evaluation completed Falls prevention discussed Falls prevention discussed;Education provided;Falls evaluation completed  Falls prevention discussed      Data saved with a previous flowsheet row definition    Assessment & Plan Migraine with aura Experiencing migraines every two months with visual disturbances. Difficulty obtaining Nurtec due to insurance. - Provide Nurtec samples. - Prescribe Imitrex for trial. - Instruct to report intolerable side effects of Imitrex via MyChart. - Proceed with prior authorization for Nurtec if Imitrex is intolerable.  Atherosclerosis of aorta and dyslipidemia Elevated LDL and family history of heart disease. Statin therapy considered pending lipid panel results. - Order comprehensive lipid panel with lipoprotein fraction analysis. - Consider statin therapy based on lipid panel results.  Restless legs syndrome Discomfort and urge to move legs at night. Left leg stiffness possibly related to arthritis. - Order iron panel, CBC, comprehensive metabolic panel, B12, folate, and vitamin D  tests. - Consider trial of medication for restless leg syndrome if iron levels are normal.  B12 deficiency Current B12 levels adequate with supplementation. - Continue current B complex vitamin. - Switch to sublingual B12 if future tests indicate deficiency.  Sarcoidosis Previous ACE test normal  - Continue annual follow-up with specialist.  Lumbar spondylosis, status post surgery Chronic discomfort and  stiffness post-surgery for myelopathy at L4 and L5.  Irritable bowel syndrome with diarrhea Chronic condition managed with Imodium. - Continue Imodium as needed for diarrhea.

## 2024-01-16 ENCOUNTER — Encounter: Payer: Self-pay | Admitting: Pulmonary Disease

## 2024-01-16 ENCOUNTER — Ambulatory Visit: Admitting: Pulmonary Disease

## 2024-01-16 VITALS — BP 110/66 | HR 64 | Temp 97.9°F | Ht 71.0 in | Wt 184.6 lb

## 2024-01-16 DIAGNOSIS — D869 Sarcoidosis, unspecified: Secondary | ICD-10-CM | POA: Diagnosis not present

## 2024-01-16 DIAGNOSIS — Z87891 Personal history of nicotine dependence: Secondary | ICD-10-CM

## 2024-01-16 DIAGNOSIS — J302 Other seasonal allergic rhinitis: Secondary | ICD-10-CM

## 2024-01-16 DIAGNOSIS — R0602 Shortness of breath: Secondary | ICD-10-CM

## 2024-01-16 DIAGNOSIS — J45909 Unspecified asthma, uncomplicated: Secondary | ICD-10-CM

## 2024-01-16 LAB — NITRIC OXIDE: Nitric Oxide: 33

## 2024-01-16 MED ORDER — ASMANEX HFA 100 MCG/ACT IN AERO: 2.0000 | INHALATION_SPRAY | Freq: Two times a day (BID) | RESPIRATORY_TRACT | 11 refills | Status: AC

## 2024-01-16 NOTE — Progress Notes (Signed)
 Subjective:    Patient ID: Jorge Palmer, male    DOB: 12/23/1970, 53 y.o.   MRN: [id number]  Patient Care Team: Sowles, Krichna, MD as PCP - General (Family Medicine) Gust Royden ORN, MD as Consulting Physician (Orthopedic Surgery) Isenstein, Arin L, MD (Dermatology) Tamea Dedra CROME, MD as Consulting Physician (Pulmonary Disease)  Chief Complaint  Patient presents with   Medical Management of Chronic Issues    BACKGROUND/INTERVAL: Patient is a 53 year old remote former smoker who presents for follow-up on the issue of shortness of breath in the setting of prior pulmonary sarcoid.  He was last seen on 04 January 2023.  HPI Discussed the use of AI scribe software for clinical note transcription with the patient, who gave verbal consent to proceed.  History of Present Illness   Jorge Palmer is a 53 year old male with sarcoidosis who presents with shortness of breath.  He experiences shortness of breath that worsens with dry air and heat. He uses a rescue inhaler and has added a humidifier in his office to manage symptoms. Lung function tests have been normal, but previous blood work showed high levels of inflammation, which have since decreased. His nitric oxide  levels were mildly elevated at 33 parts per billion. He has previously used an inhaler, Breo, but felt worse while on it and better after discontinuing its use. He is aware of seasonal triggers such as rotting wood, leaves, and ragweed, which exacerbate his symptoms.  He experiences joint pain in his knees, back, and shoulders, which he considers normal for him. No night sweats.      DATA 10/03/2012 EBUS [facility] station 7 node ([facility]): Noncaseating granulomas 09/04/2019 CT chest without contrast: Numerous small pulmonary nodules many which are calcified.  Slightly enlarged when compared to prior examination of 09/19/2012, in particular clustered nodules in the posterior right upper lobe and the perihilar right lung.   Largest clearly benign and calcified nodules measure approximately 8 mm.  Findings consistent with pulmonary sarcoidosis that has progressed in comparison to examination dated 2014.  No evidence of associated fibrosis, no suspicious pulmonary nodules.  Numerous enlarged calcified mediastinal and hilar lymph nodes unchanged when compared to prior examination of 2014.  These all in keeping with diagnosis of sarcoidosis. 09/19/2019 ACE level: 69 units/L (normal). 11/16/2019 PFTs: FEV1 4.06 L or 97% predicted, FVC 5.46 L or 102% predicted, FEV1/FVC 78%, no bronchodilator response, lung volumes normal, diffusion capacity normal, normal study. 11/26/2022 ACE level: 60 units/L (normal). 11/30/2022 PFTs: FEV1 3.88 L or 95% predicted, FVC 4.96 L or 94% predicted, FEV1/FVC 78%, no bronchodilator response.  Lung volumes normal, diffusion capacity normal.  Normal study.  Review of Systems A 10 point review of systems was performed and it is as noted above otherwise negative.   Patient Active Problem List   Diagnosis Date Noted   Restless leg 10/04/2023   Primary osteoarthritis of right knee 03/15/2022   Migraine headache with aura 01/26/2021   Perennial allergic rhinitis with seasonal variation 06/21/2017   Irritable bowel syndrome (IBS) 06/21/2017   Lumbar spondylosis with myelopathy 01/16/2015   Pulmonary sarcoidosis 12/19/2013    Social History   Tobacco Use   Smoking status: Former    Current packs/day: 0.00    Average packs/day: 0.5 packs/day for 6.7 years (3.4 ttl pk-yrs)    Types: Cigarettes    Start date: 04/05/1997    Quit date: 12/20/2003    Years since quitting: 20.1   Smokeless tobacco: Never  Substance  Use Topics   Alcohol use: No    Comment: since 2017 never a heavy drinker    Allergies  Allergen Reactions   Sulfa Antibiotics     Rash-as an infant    Current Meds  Medication Sig   aspirin-acetaminophen -caffeine (EXCEDRIN MIGRAINE) 250-250-65 MG tablet Take 1 tablet by mouth  every 6 (six) hours as needed.   B-COMPLEX-C PO Take 1 capsule by mouth daily.   ibuprofen (ADVIL) 200 MG tablet Take by mouth.   ketoconazole (NIZORAL) 2 % shampoo Apply topically daily.   Mometasone Furoate (ASMANEX HFA) 100 MCG/ACT AERO Inhale 2 puffs into the lungs 2 (two) times daily.   Rimegepant Sulfate (NURTEC) 75 MG TBDP Take 1 tablet (75 mg total) by mouth daily as needed.   SUMAtriptan (IMITREX) 100 MG tablet Take 1 tablet (100 mg total) by mouth every 2 (two) hours as needed for migraine. May repeat in 2 hours if headache persists or recurs.    Immunization History  Administered Date(s) Administered   Influenza Split 12/19/2012   Influenza, Seasonal, Injecte, Preservative Fre 02/04/2023, 01/13/2024   Influenza,inj,Quad PF,6+ Mos 12/19/2013, 02/17/2015, 01/26/2021, 02/01/2022   Influenza-Unspecified 01/04/2016   PFIZER(Purple Top)SARS-COV-2 Vaccination 06/19/2019, 07/16/2019   PNEUMOCOCCAL CONJUGATE-20 01/26/2021   Tdap 02/10/2019   Zoster Recombinant(Shingrix ) 03/15/2023, 07/14/2023        Objective:     BP 110/66   Pulse 64   Temp 97.9 F (36.6 C) (Temporal)   Ht 5' 11 (1.803 m)   Wt 184 lb 9.6 oz (83.7 kg)   SpO2 96%   BMI 25.75 kg/m   SpO2: 96 %  GENERAL: Well-developed, well-nourished gentleman, fully ambulatory.  No distress.  No conversational dyspnea.  Phonation appears normal. HEAD: Normocephalic, atraumatic.  EYES: Pupils equal, round, reactive to light.  No scleral icterus.  MOUTH: Dentition intact, oral mucosa moist.  No thrush. NECK: Supple. No thyromegaly. Trachea midline. No JVD.  No adenopathy. PULMONARY: Good air entry bilaterally.  Coarse, otherwise, no adventitious sounds. CARDIOVASCULAR: S1 and S2. Regular rate and rhythm.  No rubs, murmurs or gallops heard. ABDOMEN: Benign. MUSCULOSKELETAL: No joint deformity, no clubbing, no edema.  NEUROLOGIC: No overt focal deficit, no gait disturbance, speech is fluent. SKIN: Intact,warm,dry.  On  limited exam, no rashes noted.  No skin lesions. PSYCH: Mood and behavior normal.    Lab Results  Component Value Date   NITRICOXIDE 33 01/16/2024  *Intermediate level of type II inflammation, clinical correlation with symptoms warranted.       Assessment & Plan:     ICD-10-CM   1. Asthma due to seasonal allergies  J45.909     2. Seasonal allergic rhinitis, unspecified trigger  J30.2     3. Shortness of breath  R06.02 Nitric oxide       Orders Placed This Encounter  Procedures   Nitric oxide     Meds ordered this encounter  Medications   Mometasone Furoate (ASMANEX HFA) 100 MCG/ACT AERO    Sig: Inhale 2 puffs into the lungs 2 (two) times daily.    Dispense:  13 g    Refill:  11    Rinse mouth well after use.   Discussion:    Asthma Mild asthma exacerbated by dry air and heat with mildly elevated nitric oxide  levels at 33 ppm. Previous inhalers worsened symptoms. Breo recommended as a low-dose option due to insurance coverage of stronger inhalers. Importance of controlling inflammation discussed. - Prescribe Asmanex (mometasone) 100 mcg inhaler, 2 puffs twice a day -  Instruct on proper inhaler use and mouth rinsing to prevent oral side effects - Advise to carry rescue inhaler at all times  Sarcoidosis Sarcoidosis with joint pain in knees, back, and shoulders. Lung function tests normal and decreased inflammation levels since last visit. No new symptoms such as night sweats.      Advised if symptoms do not improve or worsen, to please contact office for sooner follow up or seek emergency care.    I spent 36 minutes of dedicated to the care of this patient on the date of this encounter to include pre-visit review of records, face-to-face time with the patient discussing conditions above, post visit ordering of testing, clinical documentation with the electronic health record, making appropriate referrals as documented, and communicating necessary findings to members of  the patients care team.     C. Leita Sanders, MD [facility]    *This note was generated using voice recognition software/Dragon and/or AI transcription program.  Despite best efforts to proofread, errors can occur which can change the meaning. Any transcriptional errors that result from this process are unintentional and may not be fully corrected at the time of dictation.

## 2024-01-16 NOTE — Patient Instructions (Signed)
 VISIT SUMMARY:  You came in today because of shortness of breath, which worsens with dry air and heat. We discussed your history of sarcoidosis and asthma, and reviewed your current symptoms and treatments.  YOUR PLAN:  -ASTHMA: Asthma is a condition where your airways narrow and swell, making it hard to breathe. Your asthma is mild but worsens with dry air and heat. We will start you on a Asmanex inhaler, which you should use twice daily. Make sure to rinse your mouth after using the inhaler to prevent any side effects. Always carry your rescue inhaler with you.   -SARCOIDOSIS: Sarcoidosis is a disease where clusters of inflammatory cells grow in different parts of your body, often affecting the lungs and lymph glands. Your lung function tests are normal, and your inflammation levels have decreased since your last visit. You have joint pain in your knees, back, and shoulders, but no new symptoms like night sweats. We will continue to monitor your condition.  INSTRUCTIONS:  Please follow the instructions for using the aspirin inhaler and remember to rinse your mouth after each use. Carry your rescue inhaler with you at all times. We will check your insurance for coverage of a simpler corticosteroid inhaler. Continue monitoring your symptoms and avoid known triggers like rotting wood, leaves, and ragweed. Schedule a follow-up appointment in 3 months or sooner if your symptoms worsen.

## 2024-01-17 ENCOUNTER — Ambulatory Visit: Payer: Self-pay | Admitting: Family Medicine

## 2024-01-17 LAB — IRON,TIBC AND FERRITIN PANEL
%SAT: 30 % (ref 20–48)
Ferritin: 49 ng/mL (ref 38–380)
Iron: 110 ug/dL (ref 50–180)
TIBC: 361 ug/dL (ref 250–425)

## 2024-01-17 LAB — COMPREHENSIVE METABOLIC PANEL WITH GFR
AG Ratio: 1.7 (calc) (ref 1.0–2.5)
ALT: 15 U/L (ref 9–46)
AST: 17 U/L (ref 10–35)
Albumin: 4.4 g/dL (ref 3.6–5.1)
Alkaline phosphatase (APISO): 42 U/L (ref 35–144)
BUN: 18 mg/dL (ref 7–25)
CO2: 30 mmol/L (ref 20–32)
Calcium: 9.6 mg/dL (ref 8.6–10.3)
Chloride: 102 mmol/L (ref 98–110)
Creat: 0.99 mg/dL (ref 0.70–1.30)
Globulin: 2.6 g/dL (ref 1.9–3.7)
Glucose, Bld: 98 mg/dL (ref 65–99)
Potassium: 4.5 mmol/L (ref 3.5–5.3)
Sodium: 141 mmol/L (ref 135–146)
Total Bilirubin: 0.6 mg/dL (ref 0.2–1.2)
Total Protein: 7 g/dL (ref 6.1–8.1)
eGFR: 92 mL/min/1.73m2 (ref >=60)

## 2024-01-17 LAB — B12 AND FOLATE PANEL
Folate: 22.6 ng/mL
Vitamin B-12: 733 pg/mL (ref 200–1100)

## 2024-01-17 LAB — CBC WITH DIFFERENTIAL/PLATELET
Absolute Lymphocytes: 1229 {cells}/uL (ref 850–3900)
Absolute Monocytes: 372 {cells}/uL (ref 200–950)
Basophils Absolute: 82 {cells}/uL (ref 0–200)
Basophils Relative: 1.3 %
Eosinophils Absolute: 359 {cells}/uL (ref 15–500)
Eosinophils Relative: 5.7 %
HCT: 48.8 % (ref 38.5–50.0)
Hemoglobin: 16.1 g/dL (ref 13.2–17.1)
MCH: 30.6 pg (ref 27.0–33.0)
MCHC: 33 g/dL (ref 32.0–36.0)
MCV: 92.8 fL (ref 80.0–100.0)
MPV: 11 fL (ref 7.5–12.5)
Monocytes Relative: 5.9 %
Neutro Abs: 4259 {cells}/uL (ref 1500–7800)
Neutrophils Relative %: 67.6 %
Platelets: 246 Thousand/uL (ref 140–400)
RBC: 5.26 Million/uL (ref 4.20–5.80)
RDW: 12.6 % (ref 11.0–15.0)
Total Lymphocyte: 19.5 %
WBC: 6.3 Thousand/uL (ref 3.8–10.8)

## 2024-01-17 LAB — LIPOPROTEIN FRACTIONATION, NMR W/ LIPID PNL
CHOL/HDL C: 4.8 calc (ref <5.0)
CHOLESTEROL, TOTAL: 212 mg/dL — ABNORMAL HIGH (ref <200)
HDL CHOLESTEROL: 44 mg/dL (ref >39)
HDL P: 33.9 umol/L (ref >32.8)
HDL Size: 8.3 nm — ABNORMAL LOW (ref >9.0)
LARGE HDL P: <3 umol/L — ABNORMAL LOW (ref >7.2)
LARGE VLDL P: 7.5 nmol/L — ABNORMAL HIGH (ref <3.7)
LDL CHOLESTEROL: 137 mg/dL — ABNORMAL HIGH (ref <100)
LDL P: 2192 nmol/L — ABNORMAL HIGH (ref <935)
LDL SIZE: 20.6 nm (ref >20.5)
NON HDL CHOLESTEROL: 168 mg/dL — ABNORMAL HIGH (ref <130)
SMALL LDL P: 821 nmol/L — ABNORMAL HIGH (ref <467)
TG/HDL C: 3.9 calc — ABNORMAL HIGH (ref <2.0)
TRIGLYCERIDES: 173 mg/dL — ABNORMAL HIGH (ref <150)
VLDL Size: 49.7 nm — ABNORMAL HIGH (ref <47.1)

## 2024-01-17 LAB — HEMOGLOBIN A1C
Hgb A1c MFr Bld: 5.5 % (ref <5.7)
Mean Plasma Glucose: 111 mg/dL
eAG (mmol/L): 6.2 mmol/L

## 2024-01-17 LAB — HIGH SENSITIVITY CRP: hs-CRP: 1.5 mg/L

## 2024-01-18 ENCOUNTER — Other Ambulatory Visit: Payer: Self-pay | Admitting: Family Medicine

## 2024-01-18 DIAGNOSIS — G2581 Restless legs syndrome: Secondary | ICD-10-CM

## 2024-01-18 MED ORDER — ROPINIROLE HCL 0.5 MG PO TABS: 0.5000 mg | ORAL_TABLET | Freq: Every day | ORAL | 0 refills | Status: DC

## 2024-01-18 MED ORDER — ROSUVASTATIN CALCIUM 10 MG PO TABS: 10.0000 mg | ORAL_TABLET | Freq: Every day | ORAL | 1 refills | Status: AC

## 2024-01-21 ENCOUNTER — Encounter: Payer: Self-pay | Admitting: Pulmonary Disease

## 2024-03-06 DIAGNOSIS — M25571 Pain in right ankle and joints of right foot: Secondary | ICD-10-CM | POA: Diagnosis not present

## 2024-03-07 DIAGNOSIS — M25571 Pain in right ankle and joints of right foot: Secondary | ICD-10-CM | POA: Diagnosis not present

## 2024-03-08 DIAGNOSIS — M25871 Other specified joint disorders, right ankle and foot: Secondary | ICD-10-CM | POA: Diagnosis not present

## 2024-03-08 DIAGNOSIS — M24071 Loose body in right ankle: Secondary | ICD-10-CM | POA: Diagnosis not present

## 2024-03-08 DIAGNOSIS — M25471 Effusion, right ankle: Secondary | ICD-10-CM | POA: Diagnosis not present

## 2024-03-08 DIAGNOSIS — Z862 Personal history of diseases of the blood and blood-forming organs and certain disorders involving the immune mechanism: Secondary | ICD-10-CM | POA: Diagnosis not present

## 2024-03-22 ENCOUNTER — Ambulatory Visit: Admitting: Pulmonary Disease

## 2024-04-09 ENCOUNTER — Other Ambulatory Visit: Payer: Self-pay | Admitting: Family Medicine

## 2024-04-11 NOTE — Telephone Encounter (Signed)
 Requested Prescriptions  Pending Prescriptions Disp Refills   rOPINIRole  (REQUIP ) 0.5 MG tablet [Pharmacy Med Name: ROPINIROLE  HCL 0.5 MG TABLET] 90 tablet 3    Sig: TAKE 1 TABLET BY MOUTH AT BEDTIME.     Neurology:  Parkinsonian Agents Passed - 04/11/2024  8:23 AM      Passed - Last BP in normal range    BP Readings from Last 1 Encounters:  01/16/24 110/66         Passed - Last Heart Rate in normal range    Pulse Readings from Last 1 Encounters:  01/16/24 64         Passed - Valid encounter within last 12 months    Recent Outpatient Visits           2 months ago Atherosclerosis of aorta   [facility] Glenard Mire, MD   9 months ago Pulmonary sarcoidosis   [facility] Glenard Mire, MD

## 2025-01-14 ENCOUNTER — Ambulatory Visit: Admitting: Family Medicine
